# Patient Record
Sex: Male | Born: 1995 | Race: White | Hispanic: No | Marital: Single | Smoking: Never smoker
Health system: Southern US, Community
[De-identification: ages and names within clinical notes are randomized; demographics above are authoritative.]

## PROBLEM LIST (undated history)

## (undated) DIAGNOSIS — N200 Calculus of kidney: Secondary | ICD-10-CM

---
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
## 2020-04-06 ENCOUNTER — Ambulatory Visit: Payer: Self-pay

## 2021-03-09 ENCOUNTER — Other Ambulatory Visit: Payer: Self-pay

## 2021-03-09 ENCOUNTER — Encounter: Payer: Self-pay | Admitting: Emergency Medicine

## 2021-03-09 ENCOUNTER — Emergency Department
Admission: EM | Admit: 2021-03-09 | Discharge: 2021-03-09 | Disposition: A | Discharge status: Home / Self Care | Payer: Self-pay | Attending: Emergency Medicine | Admitting: Emergency Medicine

## 2021-03-09 ENCOUNTER — Emergency Department: Payer: Self-pay

## 2021-03-09 DIAGNOSIS — F17220 Nicotine dependence, chewing tobacco, uncomplicated: Secondary | ICD-10-CM | POA: Insufficient documentation

## 2021-03-09 DIAGNOSIS — R109 Unspecified abdominal pain: Secondary | ICD-10-CM

## 2021-03-09 DIAGNOSIS — N201 Calculus of ureter: Secondary | ICD-10-CM | POA: Insufficient documentation

## 2021-03-09 LAB — COMPREHENSIVE METABOLIC PANEL
ALT: 20 U/L (ref 0–44)
AST: 17 U/L (ref 15–41)
Albumin: 4.7 g/dL (ref 3.5–5.0)
Alkaline Phosphatase: 52 U/L (ref 38–126)
Anion gap: 10 (ref 5–15)
BUN: 12 mg/dL (ref 6–20)
CO2: 30 mmol/L (ref 22–32)
Calcium: 9.4 mg/dL (ref 8.9–10.3)
Chloride: 99 mmol/L (ref 98–111)
Creatinine, Ser: 0.97 mg/dL (ref 0.61–1.24)
GFR, Estimated: >60 mL/min (ref >60)
Glucose, Bld: 110 mg/dL — ABNORMAL HIGH (ref 70–99)
Potassium: 3.6 mmol/L (ref 3.5–5.1)
Sodium: 139 mmol/L (ref 135–145)
Total Bilirubin: 0.6 mg/dL (ref 0.3–1.2)
Total Protein: 7.8 g/dL (ref 6.5–8.1)

## 2021-03-09 LAB — CBC WITH DIFFERENTIAL/PLATELET
Abs Immature Granulocytes: 0.04 10*3/uL (ref 0.00–0.07)
Basophils Absolute: 0.1 10*3/uL (ref 0.0–0.1)
Basophils Relative: 1 %
Eosinophils Absolute: 0.5 10*3/uL (ref 0.0–0.5)
Eosinophils Relative: 5 %
HCT: 46.9 % (ref 39.0–52.0)
Hemoglobin: 16.6 g/dL (ref 13.0–17.0)
Immature Granulocytes: 0 %
Lymphocytes Relative: 27 %
Lymphs Abs: 2.7 10*3/uL (ref 0.7–4.0)
MCH: 30.3 pg (ref 26.0–34.0)
MCHC: 35.4 g/dL (ref 30.0–36.0)
MCV: 85.6 fL (ref 80.0–100.0)
Monocytes Absolute: 0.9 10*3/uL (ref 0.1–1.0)
Monocytes Relative: 9 %
Neutro Abs: 5.7 10*3/uL (ref 1.7–7.7)
Neutrophils Relative %: 58 %
Platelets: 405 10*3/uL — ABNORMAL HIGH (ref 150–400)
RBC: 5.48 MIL/uL (ref 4.22–5.81)
RDW: 12 % (ref 11.5–15.5)
WBC: 9.9 10*3/uL (ref 4.0–10.5)
nRBC: 0 % (ref 0.0–0.2)

## 2021-03-09 LAB — URINALYSIS, ROUTINE W REFLEX MICROSCOPIC
Bacteria, UA: NONE SEEN
Bilirubin Urine: NEGATIVE
Glucose, UA: NEGATIVE mg/dL
Ketones, ur: NEGATIVE mg/dL
Leukocytes,Ua: NEGATIVE
Nitrite: POSITIVE — AB
Protein, ur: NEGATIVE mg/dL
Specific Gravity, Urine: 1.005 (ref 1.005–1.030)
Squamous Epithelial / HPF: NONE SEEN (ref 0–5)
pH: 5 (ref 5.0–8.0)

## 2021-03-09 LAB — LIPASE, BLOOD: Lipase: 44 U/L (ref 11–51)

## 2021-03-09 MED ORDER — ONDANSETRON HCL 4 MG/2ML IJ SOLN
4.0000 mg | INTRAMUSCULAR | Status: AC
  Administered 2021-03-09: 4 mg via INTRAVENOUS
  Filled 2021-03-09: qty 2

## 2021-03-09 MED ORDER — MORPHINE SULFATE (PF) 4 MG/ML IV SOLN
4.0000 mg | Freq: Once | INTRAVENOUS | Status: AC
Start: 2021-03-09 — End: 2021-03-09
  Administered 2021-03-09: 4 mg via INTRAVENOUS
  Filled 2021-03-09: qty 1

## 2021-03-09 MED ORDER — KETOROLAC TROMETHAMINE 30 MG/ML IJ SOLN
15.0000 mg | Freq: Once | INTRAMUSCULAR | Status: AC
  Administered 2021-03-09: 15 mg via INTRAVENOUS
  Filled 2021-03-09: qty 1

## 2021-03-09 MED ORDER — OXYCODONE-ACETAMINOPHEN 5-325 MG PO TABS: 1.0000 | ORAL_TABLET | ORAL | 0 refills | Status: DC | PRN

## 2021-03-09 MED ORDER — ONDANSETRON 4 MG PO TBDP: 4.0000 mg | ORAL_TABLET | Freq: Three times a day (TID) | ORAL | 0 refills | Status: DC | PRN

## 2021-03-09 NOTE — ED Triage Notes (Signed)
Pt to ED from home c/o left flank pain starting last night, denies urinary changes or injuries or n/v/d.  Pt A&Ox4, chest rise even and unlabored, in NAD at this time.

## 2021-03-09 NOTE — ED Provider Notes (Signed)
-----------------------------------------   7:03 AM on 03/09/2021 -----------------------------------------  Blood pressure 127/62, pulse 70, temperature 97.9 F (36.6 C), temperature source Oral, resp. rate 16, height 5\' 10"  (1.778 m), weight 90.7 kg, SpO2 100 %.  Assuming care from Dr. .  In short, Jesse Carlson is a 25 y.o. male with a chief complaint of Flank Pain .  Refer to the original H&P for additional details.  The current plan of care is to follow-up imaging and labs for possible kidney stone vs msk pain.  ----------------------------------------- 10:16 AM on 03/09/2021 -----------------------------------------  Lab work is reassuring, CT scan does show small left ureterolithiasis at the UVJ.  UA is positive for nitrites but otherwise has no other sign of infection, we will send urine for culture but low suspicion for associated infection at this time.  Patient is feeling much better and is appropriate for discharge home with urology follow-up as needed.  He was counseled to return to the ED for new worsening symptoms, patient agrees with plan.    03/11/2021, MD 03/09/21 1017

## 2021-03-09 NOTE — ED Provider Notes (Signed)
[facility] Emergency Department Provider Note  ____________________________________________   Event Date/Time   First MD Initiated Contact with Patient 03/09/21 [phone]     (approximate)  I have reviewed the triage vital signs and the nursing notes.   HISTORY  Chief Complaint Flank Pain    HPI Jesse Carlson is a 25 y.o. male who has had some prior issues with lumbar strain but otherwise is healthy with no chronic conditions.  He presents for evaluation of acute onset and severe sharp stabbing pain in his right flank and lower back.  It does not radiate to the front.  It has caused him to have some nausea.  Nothing in particular makes it better or worse.  It has been coming and going over the course of the last few hours but became very severe just prior to him coming to the emergency department.  He has not noticed any urinary symptoms including hematuria nor dysuria.  He denies fever/chills, sore throat, chest pain, shortness of breath, vomiting, and lower abdominal pain.  No pain in his penis or testicles.  He has had similar episodes in the past including one time that was similar but milder where he was seen at [facility] and told that it was just a back problem like a ruptured disc.  The symptoms eventually went away.  He does not take any medications regularly and has not tried taking anything tonight.         History reviewed. No pertinent past medical history.  There are no problems to display for this patient.   History reviewed. No pertinent surgical history.  Prior to Admission medications   Medication Sig Start Date End Date Taking? Authorizing Provider  predniSONE (DELTASONE) 20 MG tablet Take 2 tablets (40 mg total) by mouth daily. 10/08/16   Minna Antis, MD    Allergies Patient has no known allergies.  History reviewed. No pertinent family history.  Social History Social History   Tobacco Use  . Smoking status: Never Smoker   . Smokeless tobacco: Current User  Substance Use Topics  . Alcohol use: No  . Drug use: Never    Review of Systems Constitutional: No fever/chills Eyes: No visual changes. ENT: No sore throat. Cardiovascular: Denies chest pain. Respiratory: Denies shortness of breath. Gastrointestinal: No abdominal pain.  Positive for nausea, no vomiting.  No diarrhea.  No constipation. Genitourinary: Negative for dysuria. Musculoskeletal: Positive for left-sided flank pain. Integumentary: Negative for rash. Neurological: Negative for headaches, focal weakness or numbness.   ____________________________________________   PHYSICAL EXAM:  VITAL SIGNS: ED Triage Vitals  Enc Vitals Group     BP 03/09/21 0616 127/62     Pulse Rate 03/09/21 0616 70     Resp 03/09/21 0616 16     Temp 03/09/21 0616 97.9 F (36.6 C)     Temp Source 03/09/21 0616 Oral     SpO2 03/09/21 0616 100 %     Weight 03/09/21 0619 90.7 kg (200 lb)     Height 03/09/21 0619 1.778 m (5\' 10" )     Head Circumference --      Peak Flow --      Pain Score 03/09/21 0616 10     Pain Loc --      Pain Edu? --      Excl. in GC? --     Constitutional: Alert and oriented.  Appears very uncomfortable. Eyes: Conjunctivae are normal.  Head: Atraumatic. Nose: No congestion/rhinnorhea. Mouth/Throat: Patient is wearing  a mask. Neck: No stridor.  No meningeal signs.   Cardiovascular: Normal rate, regular rhythm. Good peripheral circulation. Respiratory: Normal respiratory effort.  No retractions. Gastrointestinal: Soft and nontender. No distention.  Musculoskeletal: Left flank tenderness to percussion.  No tenderness to palpation of the lumbar spine.  Some tenderness to palpation of the left lumbar soft tissue. Neurologic:  Normal speech and language. No gross focal neurologic deficits are appreciated.  Skin:  Skin is warm, dry and intact. Psychiatric: Mood and affect are normal. Speech and behavior are  normal.  ____________________________________________   LABS (all labs ordered are listed, but only abnormal results are displayed)  Labs Reviewed  CBC WITH DIFFERENTIAL/PLATELET - Abnormal; Notable for the following components:      Result Value   Platelets 405 (*)    All other components within normal limits  URINALYSIS, ROUTINE W REFLEX MICROSCOPIC  COMPREHENSIVE METABOLIC PANEL  LIPASE, BLOOD   ____________________________________________  EKG  No indication for emergent EKG ____________________________________________  RADIOLOGY Marylou Mccoy, personally viewed and evaluated these images (plain radiographs) as part of my medical decision making, as well as reviewing the written report by the radiologist.  ED MD interpretation:  CT renal pending  Official radiology report(s): No results found.  ____________________________________________   PROCEDURES   Procedure(s) performed (including Critical Care):  Procedures   ____________________________________________   INITIAL IMPRESSION / MDM / ASSESSMENT AND PLAN / ED COURSE  As part of my medical decision making, I reviewed the following data within the electronic MEDICAL RECORD NUMBER History obtained from family, Nursing notes reviewed and incorporated, Labs reviewed , Old chart reviewed, Patient signed out to Dr. Larinda Buttery and Notes from prior ED visits   Differential diagnosis includes, but is not limited to, renal/ureteral colic, UTI/pyelonephritis, musculoskeletal strain, herniated disc, cauda equina syndrome.  The patient is not having any focal neurological deficits.  Strongly doubt acute neurological compromise such as cauda equina syndrome.  Herniated disc is possible that the patient's symptoms are located more in his flank and based on the episodic nature and the way he looks uncomfortable currently, I am more suspicious of ureteral colic.  Vital signs are stable and within normal limits.  Lab work pending.   Morphine 4 mg IV, Toradol 15 mg IV, and Zofran 4 mg IV.  CT renal stone protocol pending.       Clinical Course as of 03/09/21 0707  Wed Mar 09, 2021  0706 Transferring ED care to Dr. Larinda Buttery. [CF]    Clinical Course User Index [CF] Loleta Rose, MD     ____________________________________________  FINAL CLINICAL IMPRESSION(S) / ED DIAGNOSES  Final diagnoses:  Left flank pain     MEDICATIONS GIVEN DURING THIS VISIT:  Medications  morphine 4 MG/ML injection 4 mg (4 mg Intravenous Given 03/09/21 0625)  ondansetron (ZOFRAN) injection 4 mg (4 mg Intravenous Given 03/09/21 0624)  ketorolac (TORADOL) 30 MG/ML injection 15 mg (15 mg Intravenous Given 03/09/21 1610)     ED Discharge Orders    None      *Please note:  FENTON CANDEE was evaluated in Emergency Department on 03/09/2021 for the symptoms described in the history of present illness. He was evaluated in the context of the global COVID-19 pandemic, which necessitated consideration that the patient might be at risk for infection with the SARS-CoV-2 virus that causes COVID-19. Institutional protocols and algorithms that pertain to the evaluation of patients at risk for COVID-19 are in a state of rapid change based on information  released by regulatory bodies including the CDC and federal and state organizations. These policies and algorithms were followed during the patient's care in the ED.  Some ED evaluations and interventions may be delayed as a result of limited staffing during and after the pandemic.*  Note:  This document was prepared using Dragon voice recognition software and may include unintentional dictation errors.   Loleta Rose, MD 03/09/21 [phone]

## 2021-03-10 LAB — URINE CULTURE: Culture: NO GROWTH

## 2022-03-08 ENCOUNTER — Emergency Department: Payer: 59

## 2022-03-08 ENCOUNTER — Emergency Department
Admission: EM | Admit: 2022-03-08 | Discharge: 2022-03-08 | Disposition: A | Discharge status: Home / Self Care | Payer: 59 | Attending: Student in an Organized Health Care Education/Training Program | Admitting: Student in an Organized Health Care Education/Training Program

## 2022-03-08 DIAGNOSIS — R109 Unspecified abdominal pain: Secondary | ICD-10-CM | POA: Diagnosis present

## 2022-03-08 DIAGNOSIS — N200 Calculus of kidney: Secondary | ICD-10-CM

## 2022-03-08 DIAGNOSIS — R10A2 Flank pain, left side: Secondary | ICD-10-CM

## 2022-03-08 LAB — COMPREHENSIVE METABOLIC PANEL
ALT: 27 U/L (ref 0–44)
AST: 21 U/L (ref 15–41)
Albumin: 4.9 g/dL (ref 3.5–5.0)
Alkaline Phosphatase: 57 U/L (ref 38–126)
Anion gap: 8 (ref 5–15)
BUN: 20 mg/dL (ref 6–20)
CO2: 32 mmol/L (ref 22–32)
Calcium: 10.3 mg/dL (ref 8.9–10.3)
Chloride: 98 mmol/L (ref 98–111)
Creatinine, Ser: 1.27 mg/dL — ABNORMAL HIGH (ref 0.61–1.24)
GFR, Estimated: >60 mL/min (ref >60)
Glucose, Bld: 114 mg/dL — ABNORMAL HIGH (ref 70–99)
Potassium: 3.6 mmol/L (ref 3.5–5.1)
Sodium: 138 mmol/L (ref 135–145)
Total Bilirubin: 0.5 mg/dL (ref 0.3–1.2)
Total Protein: 8.4 g/dL — ABNORMAL HIGH (ref 6.5–8.1)

## 2022-03-08 LAB — URINALYSIS, ROUTINE W REFLEX MICROSCOPIC
Bilirubin Urine: NEGATIVE
Glucose, UA: NEGATIVE mg/dL
Ketones, ur: NEGATIVE mg/dL
Leukocytes,Ua: NEGATIVE
Nitrite: NEGATIVE
Protein, ur: 30 mg/dL — AB
RBC / HPF: >50 RBC/hpf — ABNORMAL HIGH (ref 0–5)
Specific Gravity, Urine: 1.023 (ref 1.005–1.030)
pH: 5 (ref 5.0–8.0)

## 2022-03-08 LAB — CBC
HCT: 50.6 % (ref 39.0–52.0)
Hemoglobin: 17.5 g/dL — ABNORMAL HIGH (ref 13.0–17.0)
MCH: 29.9 pg (ref 26.0–34.0)
MCHC: 34.6 g/dL (ref 30.0–36.0)
MCV: 86.3 fL (ref 80.0–100.0)
Platelets: 444 10*3/uL — ABNORMAL HIGH (ref 150–400)
RBC: 5.86 MIL/uL — ABNORMAL HIGH (ref 4.22–5.81)
RDW: 12.2 % (ref 11.5–15.5)
WBC: 9.1 10*3/uL (ref 4.0–10.5)
nRBC: 0 % (ref 0.0–0.2)

## 2022-03-08 MED ORDER — ONDANSETRON HCL 4 MG/2ML IJ SOLN
4.0000 mg | Freq: Once | INTRAMUSCULAR | Status: AC
  Administered 2022-03-08: 4 mg via INTRAVENOUS
  Filled 2022-03-08: qty 2

## 2022-03-08 MED ORDER — OXYCODONE-ACETAMINOPHEN 5-325 MG PO TABS: 1.0000 | ORAL_TABLET | ORAL | 0 refills | Status: DC | PRN

## 2022-03-08 MED ORDER — TAMSULOSIN HCL 0.4 MG PO CAPS: 0.4000 mg | ORAL_CAPSULE | Freq: Every day | ORAL | 0 refills | Status: DC

## 2022-03-08 MED ORDER — ONDANSETRON 4 MG PO TBDP: 4.0000 mg | ORAL_TABLET | Freq: Three times a day (TID) | ORAL | 0 refills | Status: DC | PRN

## 2022-03-08 MED ORDER — MORPHINE SULFATE (PF) 4 MG/ML IV SOLN
4.0000 mg | INTRAVENOUS | Status: DC | PRN
  Administered 2022-03-08: 4 mg via INTRAVENOUS
  Filled 2022-03-08: qty 1

## 2022-03-08 MED ORDER — KETOROLAC TROMETHAMINE 30 MG/ML IJ SOLN
15.0000 mg | Freq: Once | INTRAMUSCULAR | Status: AC
  Administered 2022-03-08: 15 mg via INTRAVENOUS
  Filled 2022-03-08: qty 1

## 2022-03-08 MED ORDER — CEPHALEXIN 500 MG PO CAPS: 500.0000 mg | ORAL_CAPSULE | Freq: Two times a day (BID) | ORAL | 0 refills | Status: AC

## 2022-03-08 NOTE — ED Provider Notes (Signed)
----------------------------------------- ?  5:35 PM on 03/08/2022 ?----------------------------------------- ?Patient care assumed from Dr. Quentin Cornwall.  Patient's urinalysis does show red blood cells which is to be expected with the patient CT findings of ureterolithiasis but also shows a mild amount of white blood cells and rare bacteria.  I have sent a urine culture as a precaution however we will cover with antibiotics as a precaution have the patient follow-up with urology.  Patient agreeable to plan of care.  Discussed my typical kidney stone return precautions. ?  ?Harvest Dark, MD ?03/08/22 1735 ? ?

## 2022-03-08 NOTE — ED Notes (Signed)
Pt. In CT. 

## 2022-03-08 NOTE — ED Triage Notes (Signed)
C/O left flank pain since early this am.  States has history of kidney stones and pain is similar. ?

## 2022-03-08 NOTE — ED Provider Notes (Signed)
? ?[facility] ?Provider Note ? ? ? Event Date/Time  ? First MD Initiated Contact with Patient 03/08/22 1311   ?  (approximate) ? ? ?History  ? ?Flank Pain ? ? ?HPI ? ?Jesse Carlson is a 26 y.o. male   with a history of kidney stones presents to the ER for evaluation of left flank pain that started last night feels consistent with previous kidney stones denies any dysuria fevers or chills.  States the pain is moderate to severe is not taking thing for pain at home. ? ?  ? ? ?Physical Exam  ? ?Triage Vital Signs: ?ED Triage Vitals [03/08/22 1310]  ?Enc Vitals Group  ?   BP (!) 145/96  ?   Pulse Rate 100  ?   Resp 16  ?   Temp 97.9 ?F (36.6 ?C)  ?   Temp Source Oral  ?   SpO2 100 %  ?   Weight 200 lb (90.7 kg)  ?   Height 5\' 11"  (1.803 m)  ?   Head Circumference   ?   Peak Flow   ?   Pain Score 10  ?   Pain Loc   ?   Pain Edu?   ?   Excl. in GC?   ? ? ?Most recent vital signs: ?Vitals:  ? 03/08/22 1310 03/08/22 1320  ?BP: (!) 145/96 (!) 131/93  ?Pulse: 100 86  ?Resp: 16   ?Temp: 97.9 ?F (36.6 ?C)   ?SpO2: 100% 100%  ? ? ? ?Constitutional: Alert  ?Eyes: Conjunctivae are normal.  ?Head: Atraumatic. ?Nose: No congestion/rhinnorhea. ?Mouth/Throat: Mucous membranes are moist.   ?Neck: Painless ROM.  ?Cardiovascular:   Good peripheral circulation. ?Respiratory: Normal respiratory effort.  No retractions.  ?Gastrointestinal: Soft and nontender.  ?Musculoskeletal:  no deformity ?Neurologic:  MAE spontaneously. No gross focal neurologic deficits are appreciated.  ?Skin:  Skin is warm, dry and intact. No rash noted. ?Psychiatric: Mood and affect are normal. Speech and behavior are normal. ? ? ? ?ED Results / Procedures / Treatments  ? ?Labs ?(all labs ordered are listed, but only abnormal results are displayed) ?Labs Reviewed  ?CBC - Abnormal; Notable for the following components:  ?    Result Value  ? RBC 5.86 (*)   ? Hemoglobin 17.5 (*)   ? Platelets 444 (*)   ? All other components within normal  limits  ?COMPREHENSIVE METABOLIC PANEL - Abnormal; Notable for the following components:  ? Glucose, Bld 114 (*)   ? Creatinine, Ser 1.27 (*)   ? Total Protein 8.4 (*)   ? All other components within normal limits  ?URINALYSIS, ROUTINE W REFLEX MICROSCOPIC  ? ? ? ?EKG ? ? ? ? ?RADIOLOGY ?Please see ED Course for my review and interpretation. ? ?I personally reviewed all radiographic images ordered to evaluate for the above acute complaints and reviewed radiology reports and findings.  These findings were personally discussed with the patient.  Please see medical record for radiology report. ? ? ? ?PROCEDURES: ? ?Critical Care performed:  ? ?Procedures ? ? ?MEDICATIONS ORDERED IN ED: ?Medications  ?morphine (PF) 4 MG/ML injection 4 mg (4 mg Intravenous Given 03/08/22 1346)  ?ondansetron (ZOFRAN) injection 4 mg (4 mg Intravenous Given 03/08/22 1345)  ?ketorolac (TORADOL) 30 MG/ML injection 15 mg (15 mg Intravenous Given 03/08/22 1406)  ? ? ? ?IMPRESSION / MDM / ASSESSMENT AND PLAN / ED COURSE  ?I reviewed the triage vital signs and the nursing notes. ?             ?               ? ?  Differential diagnosis includes, but is not limited to, stone, pyelo-, renal colic, musculoskeletal pain, colitis ? ?Patient presented to the ER for evaluation of flank pain as described above clinically appears consistent with kidney stone.  CT imaging ordered for the above differential order IV fluids IV morphine and IV Zofran. ? ? ?Clinical Course as of 03/08/22 1510  ?Wed Mar 08, 2022  ?1335 CT imaging my review and interpretation does show evidence of left ureteral stone. [PR]  ?1450 Patient much more comfortable appearing resting in no acute distress awaiting urinalysis if negative does appear stable and appropriate for outpatient follow-up at this point. [PR]  ?  ?Clinical Course User Index ?[PR] Willy Eddy, MD  ? ? ? ?FINAL CLINICAL IMPRESSION(S) / ED DIAGNOSES  ? ?Final diagnoses:  ?Left flank pain  ?Kidney stone  ? ? ? ?Rx /  DC Orders  ? ?ED Discharge Orders   ? ?      Ordered  ?  oxyCODONE-acetaminophen (PERCOCET) 5-325 MG tablet  Every 4 hours PRN,   Status:  Discontinued       ? 03/08/22 1509  ?  ondansetron (ZOFRAN-ODT) 4 MG disintegrating tablet  Every 8 hours PRN       ? 03/08/22 1509  ?  tamsulosin (FLOMAX) 0.4 MG CAPS capsule  Daily after supper       ? 03/08/22 1509  ?  oxyCODONE-acetaminophen (PERCOCET) 5-325 MG tablet  Every 4 hours PRN       ? 03/08/22 1510  ? ?  ?  ? ?  ? ? ? ?Note:  This document was prepared using Dragon voice recognition software and may include unintentional dictation errors. ? ?  ?Willy Eddy, MD ?03/08/22 1527 ? ?

## 2022-03-09 ENCOUNTER — Emergency Department
Admission: EM | Admit: 2022-03-09 | Discharge: 2022-03-09 | Disposition: A | Discharge status: Home / Self Care | Payer: 59 | Attending: Emergency Medicine | Admitting: Emergency Medicine

## 2022-03-09 ENCOUNTER — Encounter: Payer: Self-pay | Admitting: Emergency Medicine

## 2022-03-09 ENCOUNTER — Other Ambulatory Visit: Payer: Self-pay

## 2022-03-09 DIAGNOSIS — R109 Unspecified abdominal pain: Secondary | ICD-10-CM

## 2022-03-09 DIAGNOSIS — N200 Calculus of kidney: Secondary | ICD-10-CM

## 2022-03-09 DIAGNOSIS — N132 Hydronephrosis with renal and ureteral calculous obstruction: Secondary | ICD-10-CM | POA: Diagnosis not present

## 2022-03-09 DIAGNOSIS — D72829 Elevated white blood cell count, unspecified: Secondary | ICD-10-CM | POA: Diagnosis not present

## 2022-03-09 HISTORY — DX: Calculus of kidney: N20.0

## 2022-03-09 LAB — BASIC METABOLIC PANEL WITH GFR
Anion gap: 8 (ref 5–15)
BUN: 19 mg/dL (ref 6–20)
CO2: 31 mmol/L (ref 22–32)
Calcium: 9.9 mg/dL (ref 8.9–10.3)
Chloride: 99 mmol/L (ref 98–111)
Creatinine, Ser: 1.23 mg/dL (ref 0.61–1.24)
GFR, Estimated: >60 mL/min
Glucose, Bld: 120 mg/dL — ABNORMAL HIGH (ref 70–99)
Potassium: 3.5 mmol/L (ref 3.5–5.1)
Sodium: 138 mmol/L (ref 135–145)

## 2022-03-09 LAB — URINALYSIS, ROUTINE W REFLEX MICROSCOPIC
Bacteria, UA: NONE SEEN
Bilirubin Urine: NEGATIVE
Glucose, UA: NEGATIVE mg/dL
Hgb urine dipstick: NEGATIVE
Ketones, ur: NEGATIVE mg/dL
Leukocytes,Ua: NEGATIVE
Nitrite: NEGATIVE
Protein, ur: NEGATIVE mg/dL
Specific Gravity, Urine: 1.013 (ref 1.005–1.030)
Squamous Epithelial / HPF: NONE SEEN (ref 0–5)
WBC, UA: NONE SEEN WBC/hpf (ref 0–5)
pH: 8 (ref 5.0–8.0)

## 2022-03-09 LAB — CBC
HCT: 51.6 % (ref 39.0–52.0)
Hemoglobin: 17.3 g/dL — ABNORMAL HIGH (ref 13.0–17.0)
MCH: 29.1 pg (ref 26.0–34.0)
MCHC: 33.5 g/dL (ref 30.0–36.0)
MCV: 86.9 fL (ref 80.0–100.0)
Platelets: 398 10*3/uL (ref 150–400)
RBC: 5.94 MIL/uL — ABNORMAL HIGH (ref 4.22–5.81)
RDW: 12.1 % (ref 11.5–15.5)
WBC: 12.9 10*3/uL — ABNORMAL HIGH (ref 4.0–10.5)
nRBC: 0 % (ref 0.0–0.2)

## 2022-03-09 MED ORDER — KETOROLAC TROMETHAMINE 30 MG/ML IJ SOLN
30.0000 mg | Freq: Once | INTRAMUSCULAR | Status: AC
  Administered 2022-03-09: 30 mg via INTRAVENOUS
  Filled 2022-03-09: qty 1

## 2022-03-09 MED ORDER — FENTANYL CITRATE PF 50 MCG/ML IJ SOSY
50.0000 ug | PREFILLED_SYRINGE | INTRAMUSCULAR | Status: DC | PRN
  Administered 2022-03-09: 50 ug via INTRAVENOUS
  Filled 2022-03-09 (×2): qty 1

## 2022-03-09 MED ORDER — SODIUM CHLORIDE 0.9 % IV BOLUS
1000.0000 mL | Freq: Once | INTRAVENOUS | Status: AC
  Administered 2022-03-09: 1000 mL via INTRAVENOUS

## 2022-03-09 NOTE — ED Notes (Signed)
Pt A&O, IV removed, pt given discharge instructions, pt ambulating with steady gait. 

## 2022-03-09 NOTE — Discharge Instructions (Addendum)
Your labs overall reassuring at this time.  You been hydrated in the ED and passed urine without difficulty.  Continue with previously prescribed antibiotics, pain medicine, nausea medicine, and bladder spasm medicine.  Follow-up with urology as scheduled. ?

## 2022-03-09 NOTE — ED Provider Notes (Signed)
? ? ?[facility] ?Emergency Department Provider Note ? ? ? ? Event Date/Time  ? First MD Initiated Contact with Patient 03/09/22 1545   ?  (approximate) ? ? ?History  ? ?Flank Pain ? ? ?HPI ? ?Jesse Carlson is a 26 y.o. male returns to the ED for evaluation of continued left flank pain. He was evaluated in the ED yesterday for the same. He was found to have multiple non-obstructing stones bilaterally, with a 0.4 cm stone on the left, with mild hydronephrosis. He returns today, via EMS with continued pain and decreased urinary output.  Patient has not been able to eat or drink much since his discharge yesterday.  He denies any fever, chills, nausea, or vomiting today.  He reports taking prescribed pain medicine and antibiotics as directed.  ? ? ?Physical Exam  ? ?Triage Vital Signs: ?ED Triage Vitals  ?Enc Vitals Group  ?   BP 03/09/22 1503 (!) 139/91  ?   Pulse Rate 03/09/22 1503 89  ?   Resp 03/09/22 1503 20  ?   Temp 03/09/22 1503 97.8 ?F (36.6 ?C)  ?   Temp Source 03/09/22 1503 Oral  ?   SpO2 03/09/22 1503 97 %  ?   Weight 03/09/22 1439 200 lb (90.7 kg)  ?   Height 03/09/22 1439 5\' 11"  (1.803 m)  ?   Head Circumference --   ?   Peak Flow --   ?   Pain Score 03/09/22 1439 10  ?   Pain Loc --   ?   Pain Edu? --   ?   Excl. in GC? --   ? ? ?Most recent vital signs: ?Vitals:  ? 03/09/22 2140 03/09/22 2141  ?BP: 113/70 113/78  ?Pulse: 84 88  ?Resp: 16 18  ?Temp: 98 ?F (36.7 ?C)   ?SpO2: 100% 98%  ? ? ?General Awake, no distress.  ?CV:  Good peripheral perfusion.  ?RESP:  Normal effort.  ?ABD:  No distention.  Soft and nontender.  No CVA tenderness elicited.  Normoactive bowel sounds noted. ? ? ?ED Results / Procedures / Treatments  ? ?Labs ?(all labs ordered are listed, but only abnormal results are displayed) ?Labs Reviewed  ?URINALYSIS, ROUTINE W REFLEX MICROSCOPIC - Abnormal; Notable for the following components:  ?    Result Value  ? Color, Urine YELLOW (*)   ? APPearance HAZY (*)   ?  All other components within normal limits  ?BASIC METABOLIC PANEL - Abnormal; Notable for the following components:  ? Glucose, Bld 120 (*)   ? All other components within normal limits  ?CBC - Abnormal; Notable for the following components:  ? WBC 12.9 (*)   ? RBC 5.94 (*)   ? Hemoglobin 17.3 (*)   ? All other components within normal limits  ? ? ? ?EKG ? ? ?RADIOLOGY ? ? ?ED Provider Interpretation: } ? ?CT Renal Stone Study (03/08/22) ? ?IMPRESSION: ?1. There is a 0.4 cm calculus in the proximal third of the left ?ureter with mild associated hydronephrosis and hydroureter. ?  ?2.  Multiple additional small bilateral renal calculi. ? ? ?PROCEDURES: ? ?Critical Care performed: No ? ?Procedures ? ? ?MEDICATIONS ORDERED IN ED: ?Medications  ?fentaNYL (SUBLIMAZE) injection 50 mcg (50 mcg Intravenous Given 03/09/22 1520)  ?sodium chloride 0.9 % bolus 1,000 mL (0 mLs Intravenous Stopped 03/09/22 1746)  ?ketorolac (TORADOL) 30 MG/ML injection 30 mg (30 mg Intravenous Given 03/09/22 1617)  ?sodium chloride 0.9 % bolus  1,000 mL (0 mLs Intravenous Stopped 03/09/22 2144)  ? ? ? ?IMPRESSION / MDM / ASSESSMENT AND PLAN / ED COURSE  ?I reviewed the triage vital signs and the nursing notes. ?             ?               ? ?Differential diagnosis includes, but is not limited to, urinary retention, hydronephrosis, sepsis, dehydration ? ?Patient returns to the ED for subsequent evaluation of ongoing left flank pain.  Patient presents in no acute distress, but does endorse decreased oral intake and decreased urine output since his discharge yesterday.  Patient been taking medications as prescribed, but only notes very concentrated urine.  Patient is reevaluated in the ED and found to have reassuring work-up.  Slight bump in his white blood cells from yesterday at 12.9, which is consistent with his nephrolithiasis and mild hydronephrosis noted yesterday on CT.  Patient today without any acute abdominal exam findings.  UA is without  leukocytosis, hematuria, or WBC casts.  She has received a 2 L fluid bolus in the ED, and is able to spontaneously void without difficulty.  Dr. Jason Fila patient has been resting comfortably in the room without complaints of intractable pain.  Patient's diagnosis is consistent with nephrolithiasis and dehydration. Patient will be discharged home with directions to continue previously prescribed medications including antibiotics, pain medicines, and Flomax. Patient is to follow up with urology as needed or otherwise directed. Patient is given ED precautions to return to the ED for any worsening or new symptoms. ? ? ?FINAL CLINICAL IMPRESSION(S) / ED DIAGNOSES  ? ?Final diagnoses:  ?Left flank pain  ?Nephrolithiasis  ? ? ? ?Rx / DC Orders  ? ?ED Discharge Orders   ? ? None  ? ?  ? ? ? ?Note:  This document was prepared using Dragon voice recognition software and may include unintentional dictation errors. ? ?  ?Lissa Hoard, PA-C ?03/09/22 2321 ? ?  ?Shaune Pollack, MD ?03/10/22 2006 ? ?

## 2022-03-09 NOTE — ED Triage Notes (Signed)
Pt comes into the ED via [facility] from home c/o left flank pain.  Pt has a h/o kidney stones.  Pt was seen and treated here yesterday for the same.  Pt has been taking the medications as directed.  All VSS stable at this time.  ?

## 2022-03-09 NOTE — ED Notes (Addendum)
See triage note pt c/o left sided flank pain that started yesterday. Pt was seen yesterday and states the pain came back.  Pt states he has a hx of kidney stones in the past.  ?

## 2022-03-10 LAB — URINE CULTURE: Culture: NO GROWTH

## 2022-03-23 ENCOUNTER — Emergency Department
Admission: EM | Admit: 2022-03-23 | Discharge: 2022-03-23 | Discharge status: Against Medical Advice | Payer: 59 | Attending: Emergency Medicine | Admitting: Emergency Medicine

## 2022-03-23 ENCOUNTER — Other Ambulatory Visit: Payer: Self-pay

## 2022-03-23 DIAGNOSIS — N200 Calculus of kidney: Secondary | ICD-10-CM | POA: Insufficient documentation

## 2022-03-23 DIAGNOSIS — R112 Nausea with vomiting, unspecified: Secondary | ICD-10-CM | POA: Insufficient documentation

## 2022-03-23 DIAGNOSIS — R1032 Left lower quadrant pain: Secondary | ICD-10-CM | POA: Diagnosis present

## 2022-03-23 DIAGNOSIS — Z5321 Procedure and treatment not carried out due to patient leaving prior to being seen by health care provider: Secondary | ICD-10-CM | POA: Insufficient documentation

## 2022-03-23 LAB — CBC
HCT: 48.1 % (ref 39.0–52.0)
Hemoglobin: 16.4 g/dL (ref 13.0–17.0)
MCH: 29.4 pg (ref 26.0–34.0)
MCHC: 34.1 g/dL (ref 30.0–36.0)
MCV: 86.4 fL (ref 80.0–100.0)
Platelets: 376 10*3/uL (ref 150–400)
RBC: 5.57 MIL/uL (ref 4.22–5.81)
RDW: 11.9 % (ref 11.5–15.5)
WBC: 9.9 10*3/uL (ref 4.0–10.5)
nRBC: 0 % (ref 0.0–0.2)

## 2022-03-23 LAB — URINALYSIS, ROUTINE W REFLEX MICROSCOPIC
Bacteria, UA: NONE SEEN
Bilirubin Urine: NEGATIVE
Glucose, UA: NEGATIVE mg/dL
Ketones, ur: NEGATIVE mg/dL
Leukocytes,Ua: NEGATIVE
Nitrite: NEGATIVE
Protein, ur: NEGATIVE mg/dL
Specific Gravity, Urine: 1.013 (ref 1.005–1.030)
Squamous Epithelial / HPF: NONE SEEN (ref 0–5)
pH: 7 (ref 5.0–8.0)

## 2022-03-23 LAB — BASIC METABOLIC PANEL
Anion gap: 7 (ref 5–15)
BUN: 22 mg/dL — ABNORMAL HIGH (ref 6–20)
CO2: 31 mmol/L (ref 22–32)
Calcium: 9.9 mg/dL (ref 8.9–10.3)
Chloride: 97 mmol/L — ABNORMAL LOW (ref 98–111)
Creatinine, Ser: 1.58 mg/dL — ABNORMAL HIGH (ref 0.61–1.24)
GFR, Estimated: >60 mL/min (ref >60)
Glucose, Bld: 97 mg/dL (ref 70–99)
Potassium: 4.6 mmol/L (ref 3.5–5.1)
Sodium: 135 mmol/L (ref 135–145)

## 2022-03-23 NOTE — ED Triage Notes (Signed)
Pt arrives with c/o left sided flank pain that has been ongoing for about 2 weeks. Pt was seen for kidney stone before and has not passed them yet. Pt was told to come to ED since he was having worsening pain and has not passed them yet. Pt endorses n/v.  ?

## 2022-03-24 ENCOUNTER — Telehealth: Payer: Self-pay | Admitting: Emergency Medicine

## 2022-03-24 ENCOUNTER — Emergency Department: Payer: 59

## 2022-03-24 ENCOUNTER — Emergency Department
Admission: EM | Admit: 2022-03-24 | Discharge: 2022-03-24 | Disposition: A | Discharge status: Home / Self Care | Payer: 59 | Attending: Emergency Medicine | Admitting: Emergency Medicine

## 2022-03-24 ENCOUNTER — Other Ambulatory Visit: Payer: Self-pay

## 2022-03-24 DIAGNOSIS — N200 Calculus of kidney: Secondary | ICD-10-CM | POA: Diagnosis not present

## 2022-03-24 DIAGNOSIS — R109 Unspecified abdominal pain: Secondary | ICD-10-CM

## 2022-03-24 LAB — URINALYSIS, ROUTINE W REFLEX MICROSCOPIC
Bacteria, UA: NONE SEEN
Bilirubin Urine: NEGATIVE
Glucose, UA: NEGATIVE mg/dL
Ketones, ur: NEGATIVE mg/dL
Leukocytes,Ua: NEGATIVE
Nitrite: NEGATIVE
Protein, ur: NEGATIVE mg/dL
Specific Gravity, Urine: 1.024 (ref 1.005–1.030)
pH: 6 (ref 5.0–8.0)

## 2022-03-24 LAB — BASIC METABOLIC PANEL
Anion gap: 6 (ref 5–15)
BUN: 18 mg/dL (ref 6–20)
CO2: 31 mmol/L (ref 22–32)
Calcium: 9.8 mg/dL (ref 8.9–10.3)
Chloride: 102 mmol/L (ref 98–111)
Creatinine, Ser: 1.74 mg/dL — ABNORMAL HIGH (ref 0.61–1.24)
GFR, Estimated: 56 mL/min — ABNORMAL LOW (ref >60)
Glucose, Bld: 110 mg/dL — ABNORMAL HIGH (ref 70–99)
Potassium: 4.1 mmol/L (ref 3.5–5.1)
Sodium: 139 mmol/L (ref 135–145)

## 2022-03-24 LAB — CBC
HCT: 47.5 % (ref 39.0–52.0)
Hemoglobin: 16.3 g/dL (ref 13.0–17.0)
MCH: 29.6 pg (ref 26.0–34.0)
MCHC: 34.3 g/dL (ref 30.0–36.0)
MCV: 86.4 fL (ref 80.0–100.0)
Platelets: 370 10*3/uL (ref 150–400)
RBC: 5.5 MIL/uL (ref 4.22–5.81)
RDW: 11.9 % (ref 11.5–15.5)
WBC: 9.9 10*3/uL (ref 4.0–10.5)
nRBC: 0 % (ref 0.0–0.2)

## 2022-03-24 MED ORDER — ONDANSETRON HCL 4 MG PO TABS: 4.0000 mg | ORAL_TABLET | Freq: Every day | ORAL | 1 refills | Status: DC | PRN

## 2022-03-24 MED ORDER — SODIUM CHLORIDE 0.9 % IV BOLUS
1000.0000 mL | Freq: Once | INTRAVENOUS | Status: AC
  Administered 2022-03-24: 1000 mL via INTRAVENOUS

## 2022-03-24 MED ORDER — ONDANSETRON HCL 4 MG/2ML IJ SOLN
4.0000 mg | Freq: Once | INTRAMUSCULAR | Status: AC
  Administered 2022-03-24: 4 mg via INTRAVENOUS
  Filled 2022-03-24: qty 2

## 2022-03-24 MED ORDER — TAMSULOSIN HCL 0.4 MG PO CAPS: 0.4000 mg | ORAL_CAPSULE | Freq: Every day | ORAL | 0 refills | Status: AC

## 2022-03-24 MED ORDER — KETOROLAC TROMETHAMINE 15 MG/ML IJ SOLN
15.0000 mg | Freq: Once | INTRAMUSCULAR | Status: AC
  Administered 2022-03-24: 15 mg via INTRAVENOUS
  Filled 2022-03-24: qty 1

## 2022-03-24 NOTE — Telephone Encounter (Signed)
Called patient due to left emergency department before provider exam to inquire about condition and follow up plans. No answer and no VM 

## 2022-03-24 NOTE — ED Triage Notes (Signed)
Pt here with a kidney stone and a kidney infection. Pt appt with urology on Monday but the pain has become unbearable.  ?

## 2022-03-24 NOTE — Discharge Instructions (Addendum)
Your CAT scan demonstrates that the kidney stone has moved closer to your bladder.  You may take the Flomax as prescribed to help with the passage of the stone.  As we discussed, it is very important that you stay very hydrated given your worsening kidney function.  You may take the nausea medication (Zofran) as prescribed (but please do not take more than prescribed as this may precipitate very dangerous and potentially life threatening heart rhythm problems) to help you stay hydrated.  Also, please refrain from using NSAIDs as this is cleared through your kidneys and may worsen the function as well.  Please follow-up with the urologist on Monday as you have scheduled.  Please return the emergency department immediately for any new, worsening, or change in symptoms or other concerns.  It was a pleasure caring for you today. ?

## 2022-03-24 NOTE — ED Provider Notes (Signed)
? ?[facility] ?Provider Note ? ? ? Event Date/Time  ? First MD Initiated Contact with Patient 03/24/22 1549   ?  (approximate) ? ? ?History  ? ?Flank Pain ? ? ?HPI ? ?Jesse Carlson is a 26 y.o. male who presents today for evaluation of flank pain.  Patient was seen in the emergency department on 03/08/2022 and diagnosed with a 4 mm obstructing stone.  Patient reports that his pain has persisted and he has run out of pain medication starting yesterday.  He reports that he has been nauseated but has not had any vomiting.  He denies any fevers or chills.  He reports that the pain has become "unbearable."  He took Tylenol this morning without significant improvement.  He has also been taking Benadryl to help him sleep at night. He has an appointment on 03/27/22 with Dr. Bernardo Heater with urology. ? ?I reviewed the patient's chart.  On his initial presentation on 4/26 his urinalysis demonstrated a mild amount of white blood cells and rare bacteria, therefore urine culture was sent and patient was started on antibiotics as a precaution.  Urine culture resulted 4/26 and demonstrates no growth.  Yesterday's urine was clean without leukocytes or nitrites or bacteria. ? ?I reviewed the patient's blood work.  His labs on 03/09/22 revealed a creatinine of 1.23 and creatinine obtained on 03/23/2022 revealed a creatinine of 1.58. ?  ? ? ?Physical Exam  ? ?Triage Vital Signs: ?ED Triage Vitals  ?Enc Vitals Group  ?   BP 03/24/22 1531 138/86  ?   Pulse Rate 03/24/22 1531 93  ?   Resp 03/24/22 1531 18  ?   Temp 03/24/22 1531 98.8 ?F (37.1 ?C)  ?   Temp Source 03/24/22 1531 Oral  ?   SpO2 03/24/22 1531 100 %  ?   Weight 03/24/22 1531 200 lb (90.7 kg)  ?   Height 03/24/22 1531 5\' 11"  (1.803 m)  ?   Head Circumference --   ?   Peak Flow --   ?   Pain Score 03/24/22 1536 10  ?   Pain Loc --   ?   Pain Edu? --   ?   Excl. in [place]? --   ? ? ?Most recent vital signs: ?Vitals:  ? 03/24/22 1531  ?BP: 138/86  ?Pulse: 93   ?Resp: 18  ?Temp: 98.8 ?F (37.1 ?C)  ?SpO2: 100%  ? ? ?Physical Exam ?Vitals and nursing note reviewed.  ?Constitutional:   ?   General: Awake and alert. No acute distress.  Laying on the stretcher with his eyes closed, answering questions appropriately ?   Appearance: Normal appearance. He is well-developed and normal weight.  ?HENT:  ?   Head: Normocephalic and atraumatic.  ?   Mouth/Throat:  ?   Mouth: Mucous membranes are moist.  ?Eyes:  ?   General: PERRL. Normal EOMs     ?   Right eye: No discharge.     ?   Left eye: No discharge.  ?   Conjunctiva/sclera: Conjunctivae normal.  ?Cardiovascular:  ?   Rate and Rhythm: Normal rate and regular rhythm.  ?   Pulses: Normal pulses.  ?   Heart sounds: Normal heart sounds ?Pulmonary:  ?   Effort: Pulmonary effort is normal. No respiratory distress.  ?   Breath sounds: Normal breath sounds.  ?Abdominal:  ?   Abdomen is soft. There is no abdominal tenderness. No rebound or guarding. No distention. ?Musculoskeletal:     ?  General: No swelling. Normal range of motion.  ?   Cervical back: Normal range of motion and neck supple.  ?Lymphadenopathy:  ?   Cervical: No cervical adenopathy.  ?Skin: ?   General: Skin is warm and dry.  ?   Capillary Refill: Capillary refill takes less than 2 seconds.  ?   Findings: No rash.  ?Neurological:  ?   Mental Status: He is alert.  ?  ? ? ?ED Results / Procedures / Treatments  ? ?Labs ?(all labs ordered are listed, but only abnormal results are displayed) ?Labs Reviewed  ?BASIC METABOLIC PANEL - Abnormal; Notable for the following components:  ?    Result Value  ? Glucose, Bld 110 (*)   ? Creatinine, Ser 1.74 (*)   ? GFR, Estimated 56 (*)   ? All other components within normal limits  ?URINALYSIS, ROUTINE W REFLEX MICROSCOPIC - Abnormal; Notable for the following components:  ? Color, Urine YELLOW (*)   ? APPearance CLEAR (*)   ? Hgb urine dipstick MODERATE (*)   ? All other components within normal limits  ?CBC   ? ? ? ?EKG ? ? ? ? ?RADIOLOGY ?I reviewed patients imaging and agree with findings as documented by radiologist. ? ? ? ?PROCEDURES: ? ?Critical Care performed:  ? ?Procedures ? ? ?MEDICATIONS ORDERED IN ED: ?Medications  ?sodium chloride 0.9 % bolus 1,000 mL (0 mLs Intravenous Stopped 03/24/22 1807)  ?ketorolac (TORADOL) 15 MG/ML injection 15 mg (15 mg Intravenous Given 03/24/22 1642)  ?ondansetron [facility]) injection 4 mg (4 mg Intravenous Given 03/24/22 1702)  ? ? ? ?IMPRESSION / MDM / ASSESSMENT AND PLAN / ED COURSE  ?I reviewed the triage vital signs and the nursing notes. ? ? ?Differential diagnosis includes, but is not limited to, nephrolithiasis, hydronephrosis, pyelonephritis, infected stone, AKI, dehydration.  Patient is awake and alert, hemodynamically stable and afebrile.  Labs and urinalysis were obtained.  Given that his left-sided flank pain has worsened and he has developed new right-sided flank pain, and given the duration of his symptoms, patient agreed to repeat CT renal.  In the meantime he was treated symptomatically with IV fluids and Toradol. ? ?Labs reveal an uptrending creatinine, from 1.27 (4/26) to 1.58 (5/11) to 1.74 today.  Urinalysis does not reveal infection and WBC normal. Patient hydrated with IVF. ? ?CT reveals stone that has moved distally to the UVJ with mild hydronephrosis. Suspect that the AKI is a renal azotemia due to the blocked ureter. Discussed this with Dr. Starleen Blue who feels that outpatient management on Monday as scheduled is reasonable, no emergent procedure indicated. Patient was restarted on flomax and zofran so that he can stay hydrated (reports he has had decreased PO intake due to nausea). Also advised that he not take ibuprofen/NSAIDs. We discussed return precautions. Patient and significant other understand and agree. ? ?Case discussed with Dr. Starleen Blue who agrees with assessment and plan. ? ?Clinical Course as of 03/24/22 2023  ?Fri Mar 24, 2022  ?1720 Patient  re-evaluated, reports pain is significantly improved [JP]  ?  ?Clinical Course User Index ?[JP] Tyde Lamison, Clarnce Flock, PA-C  ? ? ? ?FINAL CLINICAL IMPRESSION(S) / ED DIAGNOSES  ? ?Final diagnoses:  ?Left flank pain  ?Nephrolithiasis  ? ? ? ?Rx / DC Orders  ? ?ED Discharge Orders   ? ?      Ordered  ?  tamsulosin (FLOMAX) 0.4 MG CAPS capsule  Daily       ? 03/24/22 1722  ?  ondansetron (ZOFRAN) 4 MG tablet  Daily PRN       ? 03/24/22 1722  ? ?  ?  ? ?  ? ? ? ?Note:  This document was prepared using Dragon voice recognition software and may include unintentional dictation errors. ?  ?Marquette Old, PA-C ?03/24/22 2023 ? ?  ?Rada Hay, MD ?03/25/22 1424 ? ?

## 2022-03-26 ENCOUNTER — Emergency Department
Admission: EM | Admit: 2022-03-26 | Discharge: 2022-03-27 | Disposition: A | Discharge status: Home / Self Care | Payer: 59 | Attending: Emergency Medicine | Admitting: Emergency Medicine

## 2022-03-26 ENCOUNTER — Other Ambulatory Visit: Payer: Self-pay

## 2022-03-26 DIAGNOSIS — N200 Calculus of kidney: Secondary | ICD-10-CM | POA: Insufficient documentation

## 2022-03-26 DIAGNOSIS — N179 Acute kidney failure, unspecified: Secondary | ICD-10-CM | POA: Insufficient documentation

## 2022-03-26 DIAGNOSIS — R109 Unspecified abdominal pain: Secondary | ICD-10-CM | POA: Diagnosis present

## 2022-03-26 LAB — URINALYSIS, ROUTINE W REFLEX MICROSCOPIC
Bacteria, UA: NONE SEEN
Bilirubin Urine: NEGATIVE
Glucose, UA: NEGATIVE mg/dL
Ketones, ur: NEGATIVE mg/dL
Leukocytes,Ua: NEGATIVE
Nitrite: NEGATIVE
Protein, ur: NEGATIVE mg/dL
Specific Gravity, Urine: 1.02 (ref 1.005–1.030)
Squamous Epithelial / HPF: NONE SEEN (ref 0–5)
pH: 7 (ref 5.0–8.0)

## 2022-03-26 LAB — CBC
HCT: 46.6 % (ref 39.0–52.0)
Hemoglobin: 16.2 g/dL (ref 13.0–17.0)
MCH: 29.6 pg (ref 26.0–34.0)
MCHC: 34.8 g/dL (ref 30.0–36.0)
MCV: 85.2 fL (ref 80.0–100.0)
Platelets: 421 10*3/uL — ABNORMAL HIGH (ref 150–400)
RBC: 5.47 MIL/uL (ref 4.22–5.81)
RDW: 11.8 % (ref 11.5–15.5)
WBC: 9.1 10*3/uL (ref 4.0–10.5)
nRBC: 0 % (ref 0.0–0.2)

## 2022-03-26 LAB — BASIC METABOLIC PANEL
Anion gap: 12 (ref 5–15)
BUN: 20 mg/dL (ref 6–20)
CO2: 29 mmol/L (ref 22–32)
Calcium: 10.4 mg/dL — ABNORMAL HIGH (ref 8.9–10.3)
Chloride: 96 mmol/L — ABNORMAL LOW (ref 98–111)
Creatinine, Ser: 1.88 mg/dL — ABNORMAL HIGH (ref 0.61–1.24)
GFR, Estimated: 51 mL/min — ABNORMAL LOW (ref >60)
Glucose, Bld: 114 mg/dL — ABNORMAL HIGH (ref 70–99)
Potassium: 3.7 mmol/L (ref 3.5–5.1)
Sodium: 137 mmol/L (ref 135–145)

## 2022-03-26 MED ORDER — OXYCODONE HCL 5 MG PO TABS: 5.0000 mg | ORAL_TABLET | Freq: Four times a day (QID) | ORAL | 0 refills | Status: DC | PRN

## 2022-03-26 MED ORDER — ACETAMINOPHEN 500 MG PO TABS
1000.0000 mg | ORAL_TABLET | Freq: Once | ORAL | Status: AC
Start: 2022-03-26 — End: 2022-03-26
  Administered 2022-03-26: 1000 mg via ORAL
  Filled 2022-03-26: qty 2

## 2022-03-26 MED ORDER — ONDANSETRON HCL 4 MG/2ML IJ SOLN
4.0000 mg | Freq: Once | INTRAMUSCULAR | Status: AC
  Administered 2022-03-26: 4 mg via INTRAVENOUS
  Filled 2022-03-26: qty 2

## 2022-03-26 MED ORDER — MORPHINE SULFATE (PF) 4 MG/ML IV SOLN
6.0000 mg | Freq: Once | INTRAVENOUS | Status: AC
  Administered 2022-03-26: 6 mg via INTRAVENOUS
  Filled 2022-03-26: qty 2

## 2022-03-26 MED ORDER — SODIUM CHLORIDE 0.9 % IV BOLUS
1000.0000 mL | Freq: Once | INTRAVENOUS | Status: AC
  Administered 2022-03-26: 1000 mL via INTRAVENOUS

## 2022-03-26 MED ORDER — ONDANSETRON 4 MG PO TBDP
4.0000 mg | ORAL_TABLET | Freq: Three times a day (TID) | ORAL | 0 refills | Status: DC | PRN
Start: 2022-03-26 — End: 2022-05-03

## 2022-03-26 MED ORDER — OXYCODONE HCL 5 MG PO TABS
5.0000 mg | ORAL_TABLET | Freq: Once | ORAL | Status: AC
  Administered 2022-03-27: 5 mg via ORAL
  Filled 2022-03-26: qty 1

## 2022-03-26 MED ORDER — MORPHINE SULFATE (PF) 4 MG/ML IV SOLN
4.0000 mg | Freq: Once | INTRAVENOUS | Status: AC
  Administered 2022-03-26: 4 mg via INTRAVENOUS
  Filled 2022-03-26: qty 1

## 2022-03-26 MED ORDER — KETOROLAC TROMETHAMINE 30 MG/ML IJ SOLN
15.0000 mg | Freq: Once | INTRAMUSCULAR | Status: DC
  Filled 2022-03-26: qty 1

## 2022-03-26 MED ORDER — ONDANSETRON HCL 4 MG/2ML IJ SOLN
4.0000 mg | Freq: Once | INTRAMUSCULAR | Status: AC
  Administered 2022-03-27: 4 mg via INTRAVENOUS
  Filled 2022-03-26: qty 2

## 2022-03-26 MED ORDER — LACTATED RINGERS IV BOLUS
1000.0000 mL | Freq: Once | INTRAVENOUS | Status: AC
Start: 2022-03-26 — End: 2022-03-26
  Administered 2022-03-26: 1000 mL via INTRAVENOUS

## 2022-03-26 NOTE — Discharge Instructions (Signed)
Follow up with your urologist tomorrow 03/27/22. ?

## 2022-03-26 NOTE — ED Notes (Addendum)
Pt c/o right flank pain. Pt states he been recently diagnosed with a kidney stone and has an appointment in the morning but pain was unbearable. Pt also c/o of constant NV that started today as well.  ?

## 2022-03-26 NOTE — ED Provider Notes (Signed)
? ?[facility] ?Provider Note ? ? ? Event Date/Time  ? First MD Initiated Contact with Patient 03/26/22 2103   ?  (approximate) ? ? ?History  ? ?Flank Pain ? ? ?HPI ? ?Jesse Carlson is a 26 y.o. male  here with left flank pain. Pt has had ongoing L flank pain from 4 mm stone for the last several weeks. He has been seen multiple times for this, but has had ongoing pain. He states he's been trying otc meds, zofran, and flomax w/o relief. Has been unable to keep anything down over past 24 hours so presents for evaluation. Initially reported subjective fevers but this sounds more so like feeling flushed with pain, nothing over 100.80F. No blood in emesis. Pain is along L flank, worse with movement. No alleviating factors. Has never had lithotripsy or stents. Has an appt with Dr. Lonna Cobb tomorrow.  ? ?  ? ? ?Physical Exam  ? ?Triage Vital Signs: ?ED Triage Vitals  ?Enc Vitals Group  ?   BP 03/26/22 2039 137/86  ?   Pulse Rate 03/26/22 2039 88  ?   Resp 03/26/22 2039 17  ?   Temp 03/26/22 2039 97.7 ?F (36.5 ?C)  ?   Temp Source 03/26/22 2039 Oral  ?   SpO2 03/26/22 2039 100 %  ?   Weight 03/26/22 2040 200 lb (90.7 kg)  ?   Height 03/26/22 2040 5\' 11"  (1.803 m)  ?   Head Circumference --   ?   Peak Flow --   ?   Pain Score 03/26/22 2040 10  ?   Pain Loc --   ?   Pain Edu? --   ?   Excl. in [place]? --   ? ? ?Most recent vital signs: ?Vitals:  ? 03/27/22 0000 03/27/22 0030  ?BP: 134/87 136/85  ?Pulse: 80 73  ?Resp: 17 17  ?Temp: 98 ?F (36.7 ?C) 98.1 ?F (36.7 ?C)  ?SpO2: 100% 100%  ? ? ? ?General: Awake, appears uncomfortable/in pain. ?CV:  Good peripheral perfusion. Regular rate and rhythm. ?Resp:  Normal effort. Lungs CTAB. ?Abd:  No distention. Diffuse mild L sided ttp. ?Other:  Dry MM. ? ? ?ED Results / Procedures / Treatments  ? ?Labs ?(all labs ordered are listed, but only abnormal results are displayed) ?Labs Reviewed  ?URINALYSIS, ROUTINE W REFLEX MICROSCOPIC - Abnormal; Notable for the  following components:  ?    Result Value  ? Color, Urine YELLOW (*)   ? APPearance CLEAR (*)   ? Hgb urine dipstick SMALL (*)   ? All other components within normal limits  ?BASIC METABOLIC PANEL - Abnormal; Notable for the following components:  ? Chloride 96 (*)   ? Glucose, Bld 114 (*)   ? Creatinine, Ser 1.88 (*)   ? Calcium 10.4 (*)   ? GFR, Estimated 51 (*)   ? All other components within normal limits  ?CBC - Abnormal; Notable for the following components:  ? Platelets 421 (*)   ? All other components within normal limits  ?URINE CULTURE  ? ? ? ?EKG ? ? ? ?RADIOLOGY ?Reviewed recent ct 5/12 showing 0.4 cm stone left UVJ. ? ? ?I also independently reviewed and agree with radiologist interpretations. ? ? ?PROCEDURES: ? ?Critical Care performed: No ? ? ?MEDICATIONS ORDERED IN ED: ?Medications  ?lactated ringers bolus 1,000 mL (0 mLs Intravenous Stopped 03/26/22 2313)  ?ondansetron [facility]) injection 4 mg (4 mg Intravenous Given 03/26/22 2117)  ?morphine (PF)  4 MG/ML injection 6 mg (6 mg Intravenous Given 03/26/22 2119)  ?sodium chloride 0.9 % bolus 1,000 mL (0 mLs Intravenous Stopped 03/26/22 2313)  ?acetaminophen (TYLENOL) tablet 1,000 mg (1,000 mg Oral Given 03/26/22 2136)  ?morphine (PF) 4 MG/ML injection 4 mg (4 mg Intravenous Given 03/26/22 2314)  ?oxyCODONE (Oxy IR/ROXICODONE) immediate release tablet 5 mg (5 mg Oral Given 03/27/22 0008)  ?ondansetron [facility]) injection 4 mg (4 mg Intravenous Given 03/27/22 0006)  ?promethazine (PHENERGAN) tablet 25 mg (25 mg Oral Given 03/27/22 0053)  ? ? ? ?IMPRESSION / MDM / ASSESSMENT AND PLAN / ED COURSE  ?I reviewed the triage vital signs and the nursing notes. ?             ?               ? ? ?The patient is on the cardiac monitor to evaluate for evidence of arrhythmia and/or significant heart rate changes. ? ? ?Ddx:  ?Differential includes the following, with pertinent life- or limb-threatening emergencies considered: ? ?Renal colic, infected obstructive stone,  pyelonephritis, MSK flank pain ? ? ?MDM:  ?26 yo M with known obstructive stone here with ongoing flank pain, n/v. Pt has been taking OTC analgesics but has not had opiates at home. On arrival, pt appears uncomfortable but is nontoxic. Afebrile. CBC without leukocytosis. BMP does show increasing Cr which I suspect is combination of dehydration and NSAID use - will advise him to hold NSAIDs and continue hydration. UA without signs of infection.  ? ?Pt given IVF, analgesia with improvement in sx. He is tolerating PO currently. If pain remains controlled, pt has a Urology appt this morning, which is ideal given the duration of his sx. Rx for better analgesia at home sent, with antiemetics. Advised him to hold NSAIDs for now. ? ? ?MEDICATIONS GIVEN IN ED: ?Medications  ?lactated ringers bolus 1,000 mL (0 mLs Intravenous Stopped 03/26/22 2313)  ?ondansetron [facility]) injection 4 mg (4 mg Intravenous Given 03/26/22 2117)  ?morphine (PF) 4 MG/ML injection 6 mg (6 mg Intravenous Given 03/26/22 2119)  ?sodium chloride 0.9 % bolus 1,000 mL (0 mLs Intravenous Stopped 03/26/22 2313)  ?acetaminophen (TYLENOL) tablet 1,000 mg (1,000 mg Oral Given 03/26/22 2136)  ?morphine (PF) 4 MG/ML injection 4 mg (4 mg Intravenous Given 03/26/22 2314)  ?oxyCODONE (Oxy IR/ROXICODONE) immediate release tablet 5 mg (5 mg Oral Given 03/27/22 0008)  ?ondansetron [facility]) injection 4 mg (4 mg Intravenous Given 03/27/22 0006)  ?promethazine (PHENERGAN) tablet 25 mg (25 mg Oral Given 03/27/22 0053)  ? ? ? ?Consults:  ?None ? ? ?EMR reviewed  ?Prior ED visits, CT scans  ? ? ? ? ?FINAL CLINICAL IMPRESSION(S) / ED DIAGNOSES  ? ?Final diagnoses:  ?Kidney stone  ?AKI (acute kidney injury) (HCC)  ? ? ? ?Rx / DC Orders  ? ?ED Discharge Orders   ? ?      Ordered  ?  oxyCODONE (ROXICODONE) 5 MG immediate release tablet  Every 6 hours PRN       ? 03/26/22 2345  ?  ondansetron (ZOFRAN-ODT) 4 MG disintegrating tablet  Every 8 hours PRN       ? 03/26/22 2345  ? ?  ?  ? ?   ? ? ? ?Note:  This document was prepared using Dragon voice recognition software and may include unintentional dictation errors. ?  ?Shaune Pollack, MD ?03/27/22 0131 ? ?

## 2022-03-26 NOTE — ED Triage Notes (Signed)
Pt was seen here 2 days ago and diagnosed with kidney stones. Pt states he was trying to wait for his appt in the morning with urology but the pain has gotten much worse. Pt has been vomiting and now has a fever.  ?

## 2022-03-26 NOTE — ED Notes (Signed)
Pt unable to urinate at this time  ?Bladder scan 87ml  ? ?

## 2022-03-27 ENCOUNTER — Encounter: Payer: Self-pay | Admitting: Urology

## 2022-03-27 ENCOUNTER — Ambulatory Visit (INDEPENDENT_AMBULATORY_CARE_PROVIDER_SITE_OTHER): Payer: 59 | Admitting: Urology

## 2022-03-27 ENCOUNTER — Other Ambulatory Visit: Payer: Self-pay | Admitting: Urology

## 2022-03-27 ENCOUNTER — Telehealth: Payer: Self-pay

## 2022-03-27 VITALS — BP 138/86 | HR 77 | Ht 71.0 in | Wt 200.0 lb

## 2022-03-27 DIAGNOSIS — N200 Calculus of kidney: Secondary | ICD-10-CM | POA: Diagnosis not present

## 2022-03-27 DIAGNOSIS — N132 Hydronephrosis with renal and ureteral calculous obstruction: Secondary | ICD-10-CM

## 2022-03-27 DIAGNOSIS — N201 Calculus of ureter: Secondary | ICD-10-CM

## 2022-03-27 DIAGNOSIS — N23 Unspecified renal colic: Secondary | ICD-10-CM | POA: Diagnosis not present

## 2022-03-27 LAB — URINALYSIS, COMPLETE
Bilirubin, UA: NEGATIVE
Glucose, UA: NEGATIVE
Leukocytes,UA: NEGATIVE
Nitrite, UA: NEGATIVE
Specific Gravity, UA: 1.02 (ref 1.005–1.030)
Urobilinogen, Ur: 0.2 mg/dL (ref 0.2–1.0)
pH, UA: 7 (ref 5.0–7.5)

## 2022-03-27 LAB — MICROSCOPIC EXAMINATION

## 2022-03-27 MED ORDER — PROMETHAZINE HCL 25 MG PO TABS
25.0000 mg | ORAL_TABLET | Freq: Once | ORAL | Status: AC
  Administered 2022-03-27: 25 mg via ORAL
  Filled 2022-03-27: qty 1

## 2022-03-27 MED ORDER — CEFAZOLIN SODIUM-DEXTROSE 2-4 GM/100ML-% IV SOLN
2.0000 g | INTRAVENOUS | Status: AC
  Administered 2022-03-28: 2 g via INTRAVENOUS

## 2022-03-27 NOTE — ED Notes (Signed)
Pt was given graham cracker and apple juice. Pt tolerated well with no c/o nausea or vomiting. ?

## 2022-03-27 NOTE — Progress Notes (Signed)
[facility] Posting Form  ? ?Surgery Date/Time: Date: 03/28/2022 ? ?Surgeon: Dr. Irineo Axon, MD ? ?Surgery Location: Day Surgery ? ?Inpt ( No  )   Outpt (Yes)   Obs ( No  )  ? ?Diagnosis: N20.1 Left Ureteral Stone ? ?-CPT: 52356 ? ?Surgery: Left Ureteroscopy with laser lithotripsy and stent placement ? ?Stop Anticoagulations: N/A ? ?Cardiac/Medical/Pulmonary Clearance needed: no ? ?*Orders entered into EPIC  Date: 03/27/22  ? ?*Case booked in [place]  Date: 03/27/22 ? ?*Notified pt of Surgery: Date: 03/27/22 ? ?PRE-OP UA & CX: no ? ?*Placed into Prior Authorization Work Que Date: 03/27/22 ? ? ?Assistant/laser/rep:No ? ? ? ? ? ? ? ? ? ? ? ? ? ? ? ?

## 2022-03-27 NOTE — Progress Notes (Signed)
? ?03/27/2022 ?8:47 AM  ? ?Jesse Carlson ?08-13-1996 ?[id number] ? ?Referring provider: No referring provider defined for this encounter. ? ?Chief Complaint  ?Patient presents with  ? Nephrolithiasis  ? ? ?HPI: ?Jesse Carlson is a 26 y.o. male who presents in follow-up for a recent ED visit for renal colic.  He presents today with his friend. ? ?Initially presented to [facility] ED 03/08/2022 with complaints of left flank pain ?CT with a 4 mm left proximal ureteral calculus with mild hydronephrosis and multiple bilateral, nonobstructing renal calculi ?Repeat ED visits 4/27, 5/12 and 5/14 for flank pain after running out of pain medication.  Repeat CT 03/24/2022 showed migration of the left ureteral calculus to the UVJ ?Positive nausea/vomiting ?No fever or chills ?Creatinine yesterday evening 1.88 ?Minimal pain at present but somnolent from recent ED visit earlier this morning ?No prior history stone disease ? ? ?PMH: ?Past Medical History:  ?Diagnosis Date  ? Kidney calculus   ? ? ?Surgical History: ?No past surgical history on file. ? ?Home Medications:  ?Allergies as of 03/27/2022   ?No Known Allergies ?  ? ?  ?Medication List  ?  ? ?  ? Accurate as of Mar 27, 2022  8:47 AM. If you have any questions, ask your nurse or doctor.  ?  ?  ? ?  ? ?ondansetron 4 MG disintegrating tablet ?Commonly known as: ZOFRAN-ODT ?Take 1 tablet (4 mg total) by mouth every 8 (eight) hours as needed for nausea or vomiting. ?  ?oxyCODONE 5 MG immediate release tablet ?Commonly known as: Roxicodone ?Take 1-2 tablets (5-10 mg total) by mouth every 6 (six) hours as needed for severe pain or moderate pain (no more than 6 tabs per day). ?  ?tamsulosin 0.4 MG Caps capsule ?Commonly known as: FLOMAX ?Take 1 capsule (0.4 mg total) by mouth daily for 5 days. ?  ? ?  ? ? ?Allergies: No Known Allergies ? ?Family History: ?No family history on file. ? ?Social History:  reports that he has never smoked. He uses smokeless tobacco. He reports that  he does not drink alcohol and does not use drugs. ? ? ?Physical Exam: ?BP 138/86   Pulse 77   Ht 5\' 11"  (1.803 m)   Wt 200 lb (90.7 kg)   BMI 27.89 kg/m?   ?Constitutional:  Alert and oriented, No acute distress. ?HEENT: [place] AT, moist mucus membranes.  Trachea midline, no masses. ?Cardiovascular: RRR ?Respiratory: Normal respiratory effort, no increased work of breathing. ?GI: Abdomen is soft, nontender, nondistended, no abdominal masses ?GU: No CVA tenderness ?Skin: No rashes, bruises or suspicious lesions. ?Neurologic: Grossly intact, no focal deficits, moving all 4 extremities. ?Psychiatric: Normal mood and affect. ? ?Laboratory Data: ? ?Urinalysis ?UA ED 11-20 RBC/negative WBC ? ? ?Pertinent Imaging: ?CT images were personally reviewed and interpreted.  I do not see significant left-sided nephrolithiasis ? ?CT Renal Stone Study ? ?Narrative ?CLINICAL DATA:  Left-sided flank pain initially now with right-sided ?flank pain. Urology appointment on Monday. ? ?EXAM: ?CT ABDOMEN AND PELVIS WITHOUT CONTRAST ? ?TECHNIQUE: ?Multidetector CT imaging of the abdomen and pelvis was performed ?following the standard protocol without IV contrast. ? ?RADIATION DOSE REDUCTION: This exam was performed according to the ?departmental dose-optimization program which includes automated ?exposure control, adjustment of the mA and/or kV according to ?patient size and/or use of iterative reconstruction technique. ? ?COMPARISON:  03/08/2022 ? ?FINDINGS: ?Lower chest: Lung bases are normal. ? ?Hepatobiliary: Gallbladder, liver and biliary tree are normal. ? ?Pancreas:  Normal. ? ?Spleen: Normal. ? ?Adrenals/Urinary Tract: Adrenal glands are normal. Kidneys are ?normal in size. Several punctate right renal stones unchanged which ?are nonobstructive. Right ureter is normal. Persistent mild ?left-sided hydronephrosis. Few punctate calcifications along the ?corticomedullary junction of the left kidney. Previous seen proximal ?left ureteral  stone has moved distally and now is located over the ?distal ureter just above the UVJ. Bladder is otherwise unremarkable. ? ?Stomach/Bowel: Stomach and small bowel are normal. Appendix is ?normal. Colon is normal. ? ?Vascular/Lymphatic: Abdominal aorta is normal caliber. Remaining ?vascular structures are unremarkable. No adenopathy. ? ?Reproductive: Normal. ? ?Other: No free fluid or focal inflammatory change. ? ?Musculoskeletal: No focal abnormality. ? ?IMPRESSION: ?1. Previously seen proximal left ureteral stone has moved distally ?and now is located over the distal ureter just above the UVJ. ?Persistent mild left-sided hydronephrosis. ?2. Bilateral nephrolithiasis. ? ? ?Electronically Signed ?By: Marin Olp M.D. ?On: 03/24/2022 16:34 ? ? ?Assessment & Plan:   ? ?1.  Left distal ureteral calculus ?Multiple ED visits for recurrent pain ?We discussed various treatment options for urolithiasis including observation with or without medical expulsive therapy, shockwave lithotripsy (SWL), ureteroscopy and laser lithotripsy with stent placement. ?We discussed that management is based on stone size, location, density, patient co-morbidities, and patient preference.  ?Stones <51mm in size have a >80% spontaneous passage rate. Data surrounding the use of tamsulosin for medical expulsive therapy is controversial, but meta analyses suggests it is most efficacious for distal stones between 5-37mm in size. Possible side effects include dizziness/lightheadedness, and retrograde ejaculation. ?SWL has a lower stone free rate in a single procedure, but also a lower complication rate compared to ureteroscopy and avoids a stent and associated stent related symptoms. Possible complications include renal hematoma, steinstrasse, and need for additional treatment. ?Ureteroscopy with laser lithotripsy and stent placement has a higher stone free rate than SWL in a single procedure, however increased complication rate including possible  infection, ureteral injury, bleeding, and stent related morbidity. Common stent related symptoms include dysuria, urgency/frequency, and flank pain. ?After an extensive discussion of the risks and benefits of the above treatment options, the patient would like to proceed with ureteroscopy and will add onto scheduled tomorrow. ? ? ? ?Abbie Sons, MD ? ?[facility] ?[address] ?[place], [place] [address] ?[phone] ? ?

## 2022-03-27 NOTE — ED Notes (Signed)
Pt stated he feels better.  ?

## 2022-03-27 NOTE — Progress Notes (Signed)
Surgical Physician Order Form [facility] ? ?* Scheduling expectation :  03/28/2022 ? ?*Length of Case: 30 minutes ? ?*Clearance needed: no ? ?*Anticoagulation Instructions: N/A ? ?*Aspirin Instructions: N/A ? ?*Post-op visit Date/Instructions:  1 month follow up ? ?*Diagnosis: Left Ureteral Stone ? ?*Procedure: Left Ureteroscopy w/laser lithotripsy & stent placement (67341) ? ? ?Additional orders: N/A ? ?-Admit type: OUTpatient ? ?-Anesthesia: Choice ? ?-VTE Prophylaxis Standing Order SCD?s    ?   ?Other:  ? ?-Standing Lab Orders Per Anesthesia   ? ?Lab other: None ? ?-Standing Test orders EKG/Chest x-ray per Anesthesia      ? ?Test other:  ? ?- Medications:  Ancef 2gm IV ? ?-Other orders:  N/A ? ? ? ?  ? ?

## 2022-03-27 NOTE — ED Notes (Signed)
EDP made aware of patient having 1 episode of vomiting as he was being wheeled out. Per EDP will order 1 dose PO phenergan. EDP made aware patient requesting to stay in room overnight until F/U appt at 0800 5/15. Primary RN made aware of orders for PO phenergan at this time.  ?

## 2022-03-27 NOTE — Telephone Encounter (Signed)
I spoke with Jesse Carlson. We have discussed possible surgery dates and Tuesday May 16th was agreed upon by all parties. Patient given information about surgery date, what to expect pre-operatively and post operatively.  ?We discussed that a Pre-Admission Testing office will be calling to set up the pre-op visit that will take place prior to surgery, and that these appointments are typically done over the phone with a Pre-Admissions RN.  ?Informed patient that our office will communicate any additional care to be provided after surgery. Patients questions or concerns were discussed during our call. Advised to call our office should there be any additional information, questions or concerns that arise. Patient verbalized understanding.  ? ?

## 2022-03-27 NOTE — H&P (View-Only) (Signed)
? ?03/27/2022 ?8:47 AM  ? ?Jesse Carlson ?12/31/1995 ?[id number] ? ?Referring provider: No referring provider defined for this encounter. ? ?Chief Complaint  ?Patient presents with  ? Nephrolithiasis  ? ? ?HPI: ?Jesse Carlson is a 25 y.o. male who presents in follow-up for a recent ED visit for renal colic.  He presents today with his friend. ? ?Initially presented to [facility] ED 03/08/2022 with complaints of left flank pain ?CT with a 4 mm left proximal ureteral calculus with mild hydronephrosis and multiple bilateral, nonobstructing renal calculi ?Repeat ED visits 4/27, 5/12 and 5/14 for flank pain after running out of pain medication.  Repeat CT 03/24/2022 showed migration of the left ureteral calculus to the UVJ ?Positive nausea/vomiting ?No fever or chills ?Creatinine yesterday evening 1.88 ?Minimal pain at present but somnolent from recent ED visit earlier this morning ?No prior history stone disease ? ? ?PMH: ?Past Medical History:  ?Diagnosis Date  ? Kidney calculus   ? ? ?Surgical History: ?No past surgical history on file. ? ?Home Medications:  ?Allergies as of 03/27/2022   ?No Known Allergies ?  ? ?  ?Medication List  ?  ? ?  ? Accurate as of Mar 27, 2022  8:47 AM. If you have any questions, ask your nurse or doctor.  ?  ?  ? ?  ? ?ondansetron 4 MG disintegrating tablet ?Commonly known as: ZOFRAN-ODT ?Take 1 tablet (4 mg total) by mouth every 8 (eight) hours as needed for nausea or vomiting. ?  ?oxyCODONE 5 MG immediate release tablet ?Commonly known as: Roxicodone ?Take 1-2 tablets (5-10 mg total) by mouth every 6 (six) hours as needed for severe pain or moderate pain (no more than 6 tabs per day). ?  ?tamsulosin 0.4 MG Caps capsule ?Commonly known as: FLOMAX ?Take 1 capsule (0.4 mg total) by mouth daily for 5 days. ?  ? ?  ? ? ?Allergies: No Known Allergies ? ?Family History: ?No family history on file. ? ?Social History:  reports that he has never smoked. He uses smokeless tobacco. He reports that  he does not drink alcohol and does not use drugs. ? ? ?Physical Exam: ?BP 138/86   Pulse 77   Ht 5' 11" (1.803 m)   Wt 200 lb (90.7 kg)   BMI 27.89 kg/m?   ?Constitutional:  Alert and oriented, No acute distress. ?HEENT: [place] AT, moist mucus membranes.  Trachea midline, no masses. ?Cardiovascular: RRR ?Respiratory: Normal respiratory effort, no increased work of breathing. ?GI: Abdomen is soft, nontender, nondistended, no abdominal masses ?GU: No CVA tenderness ?Skin: No rashes, bruises or suspicious lesions. ?Neurologic: Grossly intact, no focal deficits, moving all 4 extremities. ?Psychiatric: Normal mood and affect. ? ?Laboratory Data: ? ?Urinalysis ?UA ED 11-20 RBC/negative WBC ? ? ?Pertinent Imaging: ?CT images were personally reviewed and interpreted.  I do not see significant left-sided nephrolithiasis ? ?CT Renal Stone Study ? ?Narrative ?CLINICAL DATA:  Left-sided flank pain initially now with right-sided ?flank pain. Urology appointment on Monday. ? ?EXAM: ?CT ABDOMEN AND PELVIS WITHOUT CONTRAST ? ?TECHNIQUE: ?Multidetector CT imaging of the abdomen and pelvis was performed ?following the standard protocol without IV contrast. ? ?RADIATION DOSE REDUCTION: This exam was performed according to the ?departmental dose-optimization program which includes automated ?exposure control, adjustment of the mA and/or kV according to ?patient size and/or use of iterative reconstruction technique. ? ?COMPARISON:  03/08/2022 ? ?FINDINGS: ?Lower chest: Lung bases are normal. ? ?Hepatobiliary: Gallbladder, liver and biliary tree are normal. ? ?Pancreas:   Normal. ? ?Spleen: Normal. ? ?Adrenals/Urinary Tract: Adrenal glands are normal. Kidneys are ?normal in size. Several punctate right renal stones unchanged which ?are nonobstructive. Right ureter is normal. Persistent mild ?left-sided hydronephrosis. Few punctate calcifications along the ?corticomedullary junction of the left kidney. Previous seen proximal ?left ureteral  stone has moved distally and now is located over the ?distal ureter just above the UVJ. Bladder is otherwise unremarkable. ? ?Stomach/Bowel: Stomach and small bowel are normal. Appendix is ?normal. Colon is normal. ? ?Vascular/Lymphatic: Abdominal aorta is normal caliber. Remaining ?vascular structures are unremarkable. No adenopathy. ? ?Reproductive: Normal. ? ?Other: No free fluid or focal inflammatory change. ? ?Musculoskeletal: No focal abnormality. ? ?IMPRESSION: ?1. Previously seen proximal left ureteral stone has moved distally ?and now is located over the distal ureter just above the UVJ. ?Persistent mild left-sided hydronephrosis. ?2. Bilateral nephrolithiasis. ? ? ?Electronically Signed ?By: Daniel  Boyle M.D. ?On: 03/24/2022 16:34 ? ? ?Assessment & Plan:   ? ?1.  Left distal ureteral calculus ?Multiple ED visits for recurrent pain ?We discussed various treatment options for urolithiasis including observation with or without medical expulsive therapy, shockwave lithotripsy (SWL), ureteroscopy and laser lithotripsy with stent placement. ?We discussed that management is based on stone size, location, density, patient co-morbidities, and patient preference.  ?Stones <5mm in size have a >80% spontaneous passage rate. Data surrounding the use of tamsulosin for medical expulsive therapy is controversial, but meta analyses suggests it is most efficacious for distal stones between 5-10mm in size. Possible side effects include dizziness/lightheadedness, and retrograde ejaculation. ?SWL has a lower stone free rate in a single procedure, but also a lower complication rate compared to ureteroscopy and avoids a stent and associated stent related symptoms. Possible complications include renal hematoma, steinstrasse, and need for additional treatment. ?Ureteroscopy with laser lithotripsy and stent placement has a higher stone free rate than SWL in a single procedure, however increased complication rate including possible  infection, ureteral injury, bleeding, and stent related morbidity. Common stent related symptoms include dysuria, urgency/frequency, and flank pain. ?After an extensive discussion of the risks and benefits of the above treatment options, the patient would like to proceed with ureteroscopy and will add onto scheduled tomorrow. ? ? ? ?Samanthajo Payano C Tigerlily Christine, MD ? ?[facility] ?[address], [address] ?[place], [place] [address] ?[phone] ? ?

## 2022-03-27 NOTE — ED Notes (Signed)
Pt verbalized understanding of discharge instructions, prescriptions, and follow-up care instructions.  

## 2022-03-28 ENCOUNTER — Ambulatory Visit: Payer: 59 | Admitting: Anesthesiology

## 2022-03-28 ENCOUNTER — Ambulatory Visit
Admission: RE | Admit: 2022-03-28 | Discharge: 2022-03-28 | Disposition: A | Discharge status: Home / Self Care | Payer: 59 | Attending: Urology | Admitting: Urology

## 2022-03-28 ENCOUNTER — Encounter
Admission: RE | Disposition: A | Discharge status: Home / Self Care | Payer: Self-pay | Source: Home / Self Care | Attending: Urology

## 2022-03-28 ENCOUNTER — Encounter: Payer: Self-pay | Admitting: Urology

## 2022-03-28 ENCOUNTER — Ambulatory Visit: Payer: 59

## 2022-03-28 DIAGNOSIS — N201 Calculus of ureter: Secondary | ICD-10-CM | POA: Diagnosis present

## 2022-03-28 HISTORY — PX: CYSTOSCOPY/URETEROSCOPY/HOLMIUM LASER/STENT PLACEMENT: SHX6546

## 2022-03-28 HISTORY — PX: CYSTOSCOPY W/ RETROGRADES: SHX1426

## 2022-03-28 LAB — URINE CULTURE: Culture: NO GROWTH

## 2022-03-28 SURGERY — CYSTOSCOPY/URETEROSCOPY/HOLMIUM LASER/STENT PLACEMENT
Anesthesia: General | Laterality: Left

## 2022-03-28 MED ORDER — CHLORHEXIDINE GLUCONATE 0.12 % MT SOLN: 15.0000 mL | Freq: Once | OROMUCOSAL | Status: AC

## 2022-03-28 MED ORDER — LIDOCAINE HCL (CARDIAC) PF 100 MG/5ML IV SOSY
PREFILLED_SYRINGE | INTRAVENOUS | Status: DC | PRN
  Administered 2022-03-28: 100 mg via INTRAVENOUS

## 2022-03-28 MED ORDER — SODIUM CHLORIDE 0.9 % IR SOLN
Status: DC | PRN
  Administered 2022-03-28: 3000 mL

## 2022-03-28 MED ORDER — PROMETHAZINE HCL 25 MG/ML IJ SOLN: 6.2500 mg | INTRAMUSCULAR | Status: DC | PRN

## 2022-03-28 MED ORDER — MIDAZOLAM HCL 2 MG/2ML IJ SOLN
INTRAMUSCULAR | Status: AC
  Filled 2022-03-28: qty 2

## 2022-03-28 MED ORDER — KETOROLAC TROMETHAMINE 30 MG/ML IJ SOLN
INTRAMUSCULAR | Status: DC | PRN
Start: 2022-03-28 — End: 2022-03-28
  Administered 2022-03-28: 30 mg via INTRAVENOUS

## 2022-03-28 MED ORDER — FENTANYL CITRATE (PF) 100 MCG/2ML IJ SOLN
INTRAMUSCULAR | Status: DC | PRN
  Administered 2022-03-28: 100 ug via INTRAVENOUS

## 2022-03-28 MED ORDER — ONDANSETRON HCL 4 MG/2ML IJ SOLN
INTRAMUSCULAR | Status: DC | PRN
  Administered 2022-03-28: 4 mg via INTRAVENOUS

## 2022-03-28 MED ORDER — LACTATED RINGERS IV SOLN: INTRAVENOUS | Status: DC

## 2022-03-28 MED ORDER — PROPOFOL 10 MG/ML IV BOLUS
INTRAVENOUS | Status: DC | PRN
  Administered 2022-03-28: 200 mg via INTRAVENOUS

## 2022-03-28 MED ORDER — ORAL CARE MOUTH RINSE: 15.0000 mL | Freq: Once | OROMUCOSAL | Status: AC

## 2022-03-28 MED ORDER — DEXAMETHASONE SODIUM PHOSPHATE 10 MG/ML IJ SOLN
INTRAMUSCULAR | Status: DC | PRN
  Administered 2022-03-28: 10 mg via INTRAVENOUS

## 2022-03-28 MED ORDER — FENTANYL CITRATE (PF) 100 MCG/2ML IJ SOLN
INTRAMUSCULAR | Status: AC
  Filled 2022-03-28: qty 2

## 2022-03-28 MED ORDER — CEFAZOLIN SODIUM-DEXTROSE 2-4 GM/100ML-% IV SOLN
INTRAVENOUS | Status: AC
  Filled 2022-03-28: qty 100

## 2022-03-28 MED ORDER — IOHEXOL 180 MG/ML  SOLN
INTRAMUSCULAR | Status: DC | PRN
  Administered 2022-03-28: 10 mL

## 2022-03-28 MED ORDER — LACTATED RINGERS IV SOLN
INTRAVENOUS | Status: DC | PRN
Start: 2022-03-28 — End: 2022-03-28

## 2022-03-28 MED ORDER — CHLORHEXIDINE GLUCONATE 0.12 % MT SOLN
OROMUCOSAL | Status: AC
  Administered 2022-03-28: 15 mL via OROMUCOSAL
  Filled 2022-03-28: qty 15

## 2022-03-28 MED ORDER — OXYBUTYNIN CHLORIDE 5 MG PO TABS: ORAL_TABLET | ORAL | 0 refills | Status: DC

## 2022-03-28 MED ORDER — FENTANYL CITRATE (PF) 100 MCG/2ML IJ SOLN: 25.0000 ug | INTRAMUSCULAR | Status: DC | PRN

## 2022-03-28 SURGICAL SUPPLY — 28 items
BAG DRAIN CYSTO-URO LG1000N (MISCELLANEOUS) ×2 IMPLANT
BASKET ZERO TIP 1.9FR (BASKET) ×1 IMPLANT
BRUSH SCRUB EZ 1% IODOPHOR (MISCELLANEOUS) ×2 IMPLANT
BSKT STON RTRVL ZERO TP 1.9FR (BASKET) ×1
CATH URET FLEX-TIP 2 LUMEN 10F (CATHETERS) IMPLANT
CATH URETL OPEN END 6X70 (CATHETERS) IMPLANT
CNTNR SPEC 2.5X3XGRAD LEK (MISCELLANEOUS)
CONT SPEC 4OZ STER OR WHT (MISCELLANEOUS)
CONT SPEC 4OZ STRL OR WHT (MISCELLANEOUS)
CONTAINER SPEC 2.5X3XGRAD LEK (MISCELLANEOUS) IMPLANT
DRAPE UTILITY 15X26 TOWEL STRL (DRAPES) ×2 IMPLANT
GLOVE SURG UNDER POLY LF SZ7.5 (GLOVE) ×2 IMPLANT
GOWN STRL REUS W/ TWL LRG LVL3 (GOWN DISPOSABLE) ×1 IMPLANT
GOWN STRL REUS W/ TWL XL LVL3 (GOWN DISPOSABLE) ×1 IMPLANT
GOWN STRL REUS W/TWL LRG LVL3 (GOWN DISPOSABLE) ×2
GOWN STRL REUS W/TWL XL LVL3 (GOWN DISPOSABLE) ×2
GUIDEWIRE STR DUAL SENSOR (WIRE) ×2 IMPLANT
IV NS IRRIG 3000ML ARTHROMATIC (IV SOLUTION) ×2 IMPLANT
KIT TURNOVER CYSTO (KITS) ×2 IMPLANT
PACK CYSTO AR (MISCELLANEOUS) ×2 IMPLANT
SET CYSTO W/LG BORE CLAMP LF (SET/KITS/TRAYS/PACK) ×2 IMPLANT
SHEATH NAVIGATOR HD 12/14X36 (SHEATH) IMPLANT
STENT URET 6FRX24 CONTOUR (STENTS) IMPLANT
STENT URET 6FRX26 CONTOUR (STENTS) IMPLANT
SURGILUBE 2OZ TUBE FLIPTOP (MISCELLANEOUS) ×2 IMPLANT
TRACTIP FLEXIVA PULSE ID 200 (Laser) ×2 IMPLANT
VALVE UROSEAL ADJ ENDO (VALVE) IMPLANT
WATER STERILE IRR 500ML POUR (IV SOLUTION) ×2 IMPLANT

## 2022-03-28 NOTE — Interval H&P Note (Signed)
History and Physical Interval Note: ? ?03/28/2022 ?2:21 PM ? ?Jesse Carlson  has presented today for surgery, with the diagnosis of Left Ureteral Stone.  The various methods of treatment have been discussed with the patient and family. After consideration of risks, benefits and other options for treatment, the patient has consented to  Procedure(s): ?CYSTOSCOPY/URETEROSCOPY/HOLMIUM LASER/STENT PLACEMENT (Left) as a surgical intervention.  The patient's history has been reviewed, patient examined, no change in status, stable for surgery.  I have reviewed the patient's chart and labs.  Questions were answered to the patient's satisfaction.   ? ? ?Shakevia Sarris C Lessie Manigo ? ? ?

## 2022-03-28 NOTE — Discharge Instructions (Addendum)
DISCHARGE INSTRUCTIONS FOR KIDNEY STONE/URETERAL STENT  ? ?MEDICATIONS:  ?1. Resume all your other meds from home.  ?2.  AZO (over-the-counter) can help with the burning/stinging when you urinate. ?3.  Oxybutynin is for bladder/stent irritation Rx was sent to your pharmacy. ? ?ACTIVITY:  ?1. May resume regular activities in 24 hours. ?2. No driving while on narcotic pain medications  ?3. Drink plenty of water  ?4. Continue to walk at home - you can still get blood clots when you are at home, so keep active, but don't over do it.  ?5. May return to work/school tomorrow or when you feel ready  ? ?BATHING:  ?1. You can shower. ?2. You have a string coming from your urethra: The stent string is attached to your ureteral stent. Do not pull on this.  ? ?SIGNS/SYMPTOMS TO CALL:  ?Common postoperative symptoms include urinary frequency, urgency, bladder spasm and blood in the urine ? ?Please call us if you have a fever greater than 101.5, uncontrolled nausea/vomiting, uncontrolled pain, dizziness, unable to urinate, excessively bloody urine, chest pain, shortness of breath, leg swelling, leg pain, or any other concerns or questions.  ? ?You can reach [place] at [phone].  ? ?FOLLOW-UP:  ?1. You we will be contacted for a follow-up appointment ?2. You have a string attached to your stent, you may remove it on Friday, 03/31/2022. To do this, pull the string until the stent is completely removed. You may feel an odd sensation in your back. ? ? ?AMBULATORY SURGERY  ?DISCHARGE INSTRUCTIONS ? ? ?The drugs that you were given will stay in your system until tomorrow so for the next 24 hours you should not: ? ?Drive an automobile ?Make any legal decisions ?Drink any alcoholic beverage ? ? ?You may resume regular meals tomorrow.  Today it is better to start with liquids and gradually work up to solid foods. ? ?You may eat anything you prefer, but it is better to start with liquids, then soup and crackers, and gradually work up to solid  foods. ? ? ?Please notify your doctor immediately if you have any unusual bleeding, trouble breathing, redness and pain at the surgery site, drainage, fever, or pain not relieved by medication. ? ? ? ?Additional Instructions: ? ? ? ? ? ? ? ?Please contact your physician with any problems or Same Day Surgery at [phone], Monday through Friday 6 am to 4 pm, or [place] at [facility] number at [phone].  ?

## 2022-03-28 NOTE — Anesthesia Procedure Notes (Signed)
Procedure Name: LMA Insertion ?Date/Time: 03/28/2022 2:26 PM ?Performed by: Philbert Riser, CRNA ?Pre-anesthesia Checklist: Patient identified, Patient being monitored, Timeout performed, Emergency Drugs available and Suction available ?Patient Re-evaluated:Patient Re-evaluated prior to induction ?Oxygen Delivery Method: Circle system utilized ?Preoxygenation: Pre-oxygenation with 100% oxygen ?Induction Type: IV induction ?Ventilation: Mask ventilation without difficulty ?LMA: LMA inserted ?LMA Size: 4.5 ?Tube type: Oral ?Number of attempts: 1 ?Placement Confirmation: positive ETCO2 and breath sounds checked- equal and bilateral ?Tube secured with: Tape ?Dental Injury: Teeth and Oropharynx as per pre-operative assessment  ? ? ? ? ?

## 2022-03-28 NOTE — Anesthesia Preprocedure Evaluation (Signed)
Anesthesia Evaluation  ?Patient identified by MRN, date of birth, ID band ?Patient awake ? ? ? ?Reviewed: ?Allergy & Precautions, H&P , NPO status , Patient's Chart, lab work & pertinent test results, reviewed documented beta blocker date and time  ? ?History of Anesthesia Complications ?Negative for: history of anesthetic complications ? ?Airway ?Mallampati: II ? ?TM Distance: >3 FB ?Neck ROM: full ? ? ? Dental ? ?(+) Dental Advidsory Given, Teeth Intact ?  ?Pulmonary ?neg pulmonary ROS,  ?  ?Pulmonary exam normal ?breath sounds clear to auscultation ? ? ? ? ? ? Cardiovascular ?Exercise Tolerance: Good ?negative cardio ROS ?Normal cardiovascular exam ?Rhythm:regular Rate:Normal ? ? ?  ?Neuro/Psych ?negative neurological ROS ? negative psych ROS  ? GI/Hepatic ?negative GI ROS, Neg liver ROS,   ?Endo/Other  ?negative endocrine ROS ? Renal/GU ?Renal disease (Kidney stones)  ?negative genitourinary ?  ?Musculoskeletal ? ? Abdominal ?  ?Peds ? Hematology ?negative hematology ROS ?(+)   ?Anesthesia Other Findings ?Past Medical History: ?No date: Kidney calculus ? ? Reproductive/Obstetrics ?negative OB ROS ? ?  ? ? ? ? ? ? ? ? ? ? ? ? ? ?  ?  ? ? ? ? ? ? ? ? ?Anesthesia Physical ?Anesthesia Plan ? ?ASA: 2 ? ?Anesthesia Plan: General  ? ?Post-op Pain Management:   ? ?Induction: Intravenous ? ?PONV Risk Score and Plan: 2 and Ondansetron, Dexamethasone, Midazolam and Treatment may vary due to age or medical condition ? ?Airway Management Planned: LMA ? ?Additional Equipment:  ? ?Intra-op Plan:  ? ?Post-operative Plan: Extubation in OR ? ?Informed Consent: I have reviewed the patients History and Physical, chart, labs and discussed the procedure including the risks, benefits and alternatives for the proposed anesthesia with the patient or authorized representative who has indicated his/her understanding and acceptance.  ? ? ? ?Dental Advisory Given ? ?Plan Discussed with: Anesthesiologist,  CRNA and Surgeon ? ?Anesthesia Plan Comments:   ? ? ? ? ? ? ?Anesthesia Quick Evaluation ? ?

## 2022-03-28 NOTE — Op Note (Signed)
Preoperative diagnosis: Left distal ureteral calculus ? ?Postoperative diagnosis: Left distal ureteral calculus ? ?Procedure: ? ?Cystoscopy ?Left ureteroscopy and stone removal ?Ureteroscopic laser lithotripsy ?Left ureteral stent placement (23F/26 cm)  ?Left retrograde pyelography with interpretation ?Intraoperative fluoroscopy ? ?Surgeon: Ronda Fairly. Dionisia Pacholski, M.D. ? ?Anesthesia: General ? ?Complications: None ? ?Intraoperative findings:  ?Urethra normal in caliber without stricture.  Prostate nonocclusive and normal bladder neck.  Bladder mucosa without erythema, solid or papillary lesions.  He was normal-appearing with clear efflux ?Ureteroscopy-calculus midportion left distal ureter with mild mucosal inflammatory changes. ?Left retrograde pyelography post procedure showed no filling defects, stone fragments or contrast extravasation ? ?EBL: Minimal ? ?Specimens: ?Calculus fragments for analysis ? ? ?Indication: NIL AMIRIAN is a 26 y.o. male with a 4 mm left distal ureteral calculus.  Has had multiple ED visits since late April for recurrent flank pain.  Follow-up CT 5/12 showed persistent calculus.  After reviewing the management options for treatment, the patient elected to proceed with the above surgical procedure(s). We have discussed the potential benefits and risks of the procedure, side effects of the proposed treatment, the likelihood of the patient achieving the goals of the procedure, and any potential problems that might occur during the procedure or recuperation. Informed consent has been obtained. ? ?Description of procedure: ? ?The patient was taken to the operating room and general anesthesia was induced.  The patient was placed in the dorsal lithotomy position, prepped and draped in the usual sterile fashion, and preoperative antibiotics were administered. A preoperative time-out was performed.  ? ?A 21 French cystoscope was lubricated, passed per urethra and advanced proximally under direct  vision with findings as described above.  ? ?Attention was directed to the left ureteral orifice and a 0.038 Sensor wire was then advanced up the ureter into the renal pelvis under fluoroscopic guidance. ? ?A 4.5 Fr semirigid ureteroscope was then advanced into the ureter next to the guidewire and the calculus was identified as described above. ? ?The stone was then fragmented with a 242 ?m holmium laser fiber on a setting of 0.3 J/40 Hz. ? ?All fragments were then removed from the ureter with a zero tip nitinol basket.  Reinspection of the ureter revealed no remaining visible stones or fragments.  The ureteroscope was advanced to the upper proximal ureter. ? ?Retrograde pyelogram was performed with findings as described above. ? ?A 23F/26 cm Contour ureteral stent with tether was placed under fluoroscopic guidance.  The wire was then removed with an adequate stent curl noted in the renal pelvis as well as in the bladder.  The stent tether was secured to the dorsal penis with benzoin and a Tegaderm ? ?The patient appeared to tolerate the procedure well and without complications.  After anesthetic reversal the patient was transported to the PACU in stable condition.  ? ?Plan: ?He was instructed to remove the stent on Friday, 03/31/2022 ?Postop follow-up approximately 1 month ? ? ?John Giovanni, MD ? ?

## 2022-03-28 NOTE — Anesthesia Postprocedure Evaluation (Signed)
Anesthesia Post Note ? ?Patient: Jesse Carlson ? ?Procedure(s) Performed: CYSTOSCOPY/URETEROSCOPY/HOLMIUM LASER/STENT PLACEMENT (Left) ?CYSTOSCOPY WITH RETROGRADE PYELOGRAM (Left) ? ?Patient location during evaluation: PACU ?Anesthesia Type: General ?Level of consciousness: awake and alert ?Pain management: pain level controlled ?Vital Signs Assessment: post-procedure vital signs reviewed and stable ?Respiratory status: spontaneous breathing, nonlabored ventilation, respiratory function stable and patient connected to nasal cannula oxygen ?Cardiovascular status: blood pressure returned to baseline and stable ?Postop Assessment: no apparent nausea or vomiting ?Anesthetic complications: no ? ? ?No notable events documented. ? ? ?Last Vitals:  ?Vitals:  ? 03/28/22 1532 03/28/22 1548  ?BP: 136/83 132/75  ?Pulse: 95 83  ?Resp: 12 16  ?Temp: 36.6 ?C 36.7 ?C  ?SpO2: 99% 99%  ?  ?Last Pain:  ?Vitals:  ? 03/28/22 1548  ?TempSrc: Temporal  ?PainSc: 0-No pain  ? ? ?  ?  ?  ?  ?  ?  ? ?Lenard Simmer ? ? ? ? ?

## 2022-03-28 NOTE — Transfer of Care (Addendum)
Immediate Anesthesia Transfer of Care Note ? ?Patient: Jesse Carlson ? ?Procedure(s) Performed: CYSTOSCOPY/URETEROSCOPY/HOLMIUM LASER/STENT PLACEMENT (Left) ?CYSTOSCOPY WITH RETROGRADE PYELOGRAM (Left) ? ?Patient Location: PACU ? ?Anesthesia Type:General ? ?Level of Consciousness: drowsy ? ?Airway & Oxygen Therapy: Patient Spontanous Breathing and Patient connected to face mask oxygen ? ?Post-op Assessment: Report given to RN and Post -op Vital signs reviewed and stable ? ?Post vital signs: Reviewed and stable ? ?Last Vitals:  ?Vitals Value Taken Time  ?BP    ?Temp    ?Pulse    ?Resp    ?SpO2    ? ? ?Last Pain:  ?Vitals:  ? 03/28/22 1400  ?TempSrc: Temporal  ?PainSc: 6   ?   ? ?  ? ?Complications: No notable events documented. ?

## 2022-03-29 ENCOUNTER — Encounter: Payer: Self-pay | Admitting: Urology

## 2022-04-03 LAB — CALCULI, WITH PHOTOGRAPH (CLINICAL LAB)
Calcium Oxalate Monohydrate: 100 %
Weight Calculi: 8 mg

## 2022-04-26 IMAGING — CT CT RENAL STONE PROTOCOL
1 of 3 series · 14 of 32 positions shown, 19 images · non-contrast
Comparison: 03/09/2021

CLINICAL DATA: Left flank pain



[Series 3: thins · axial · 0.77mm/px · z∈[-834,-403]mm · 14 of 685 slices shown, 19 images]
[im 35/685  soft-tissue]
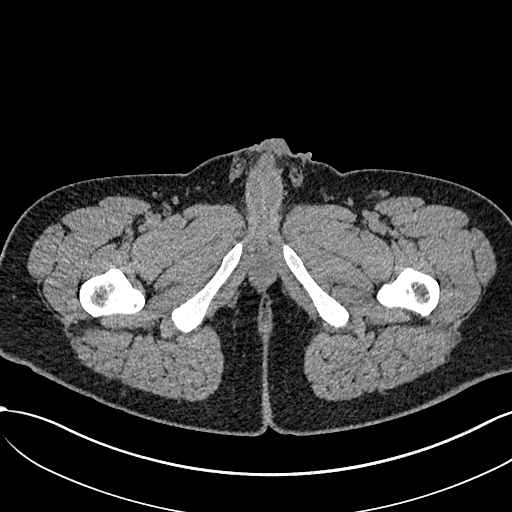
[im 35/685  bone]
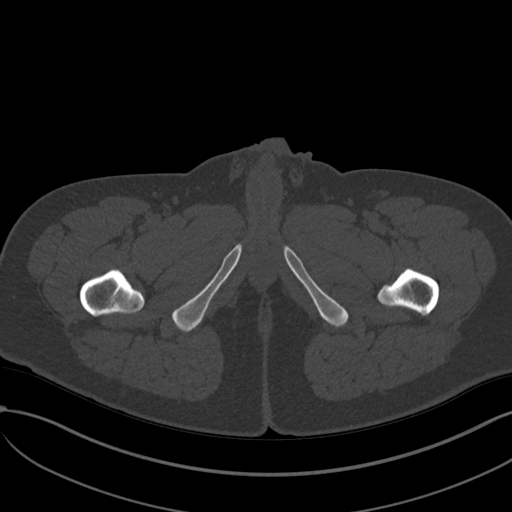
[im 103/685  soft-tissue]
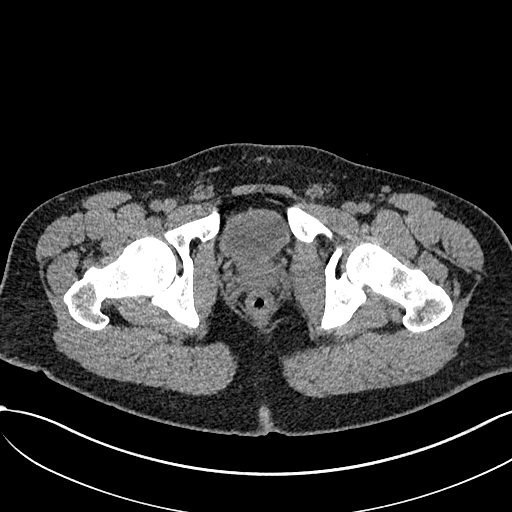
[im 137/685  soft-tissue]
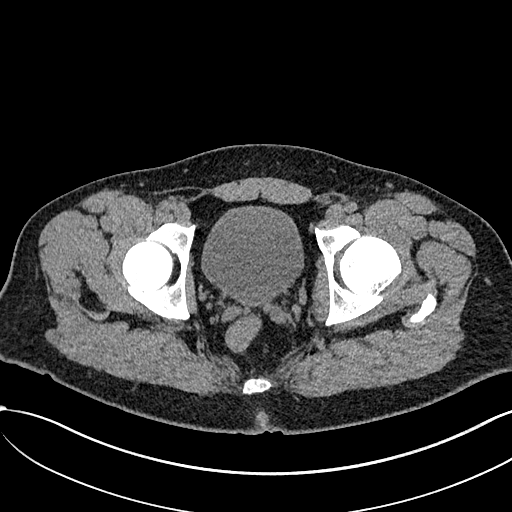
[im 206/685  soft-tissue]
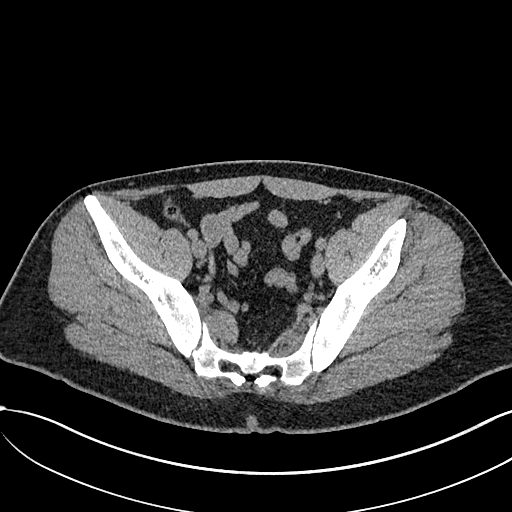
[im 240/685  soft-tissue]
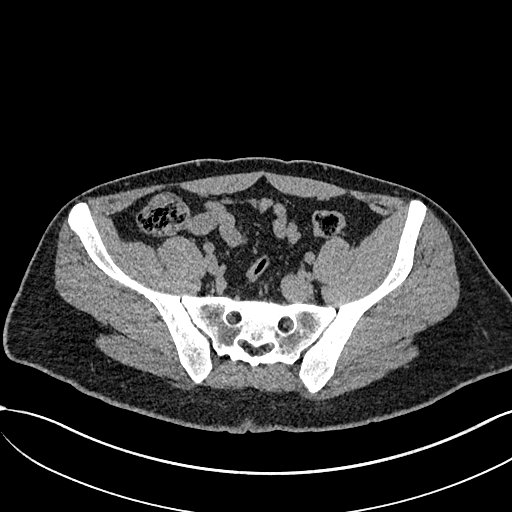
[im 308/685  soft-tissue]
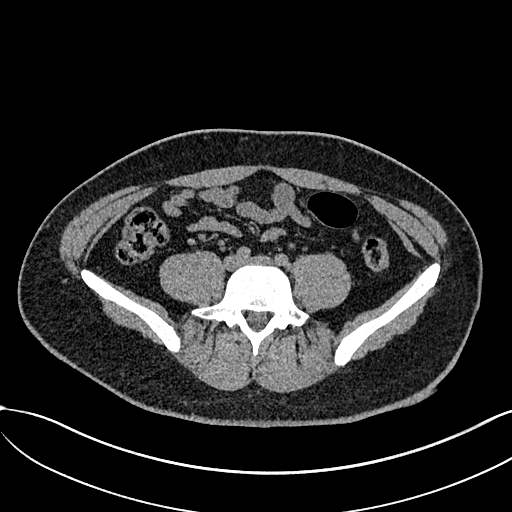
[im 343/685  soft-tissue]
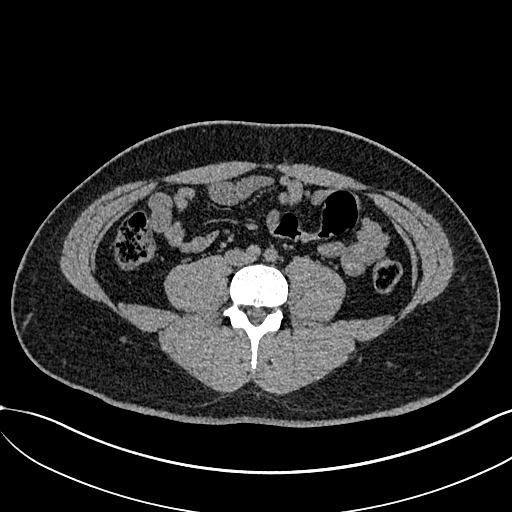
[im 377/685  soft-tissue]
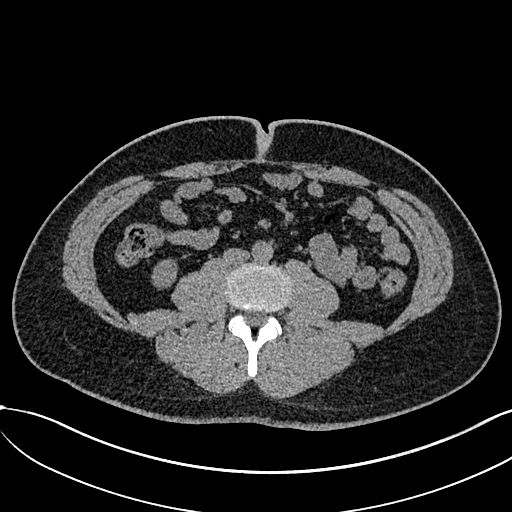
[im 445/685  soft-tissue]
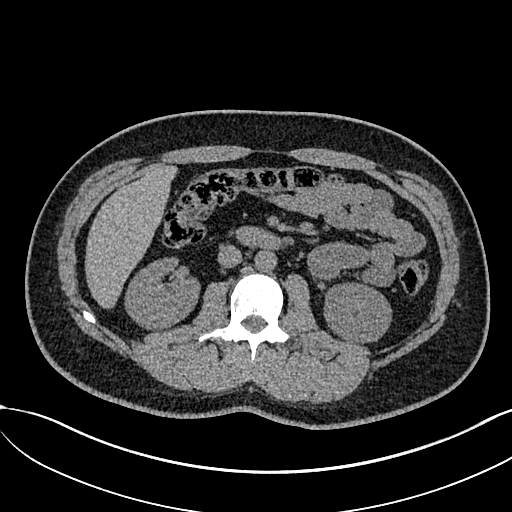
[im 445/685  bone]
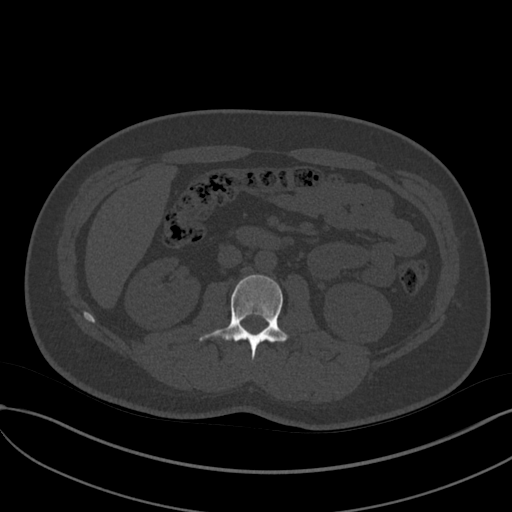
[im 479/685  soft-tissue]
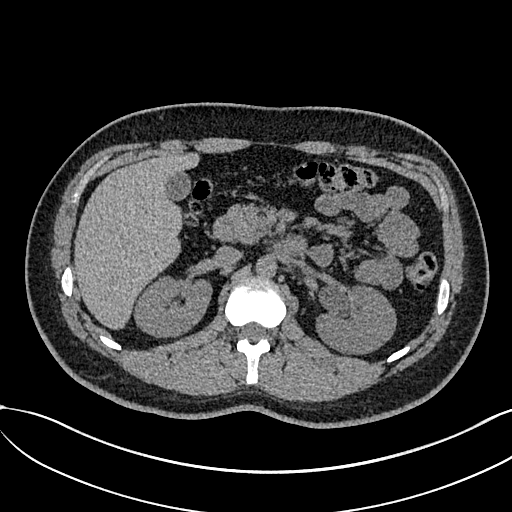
[im 548/685  soft-tissue]
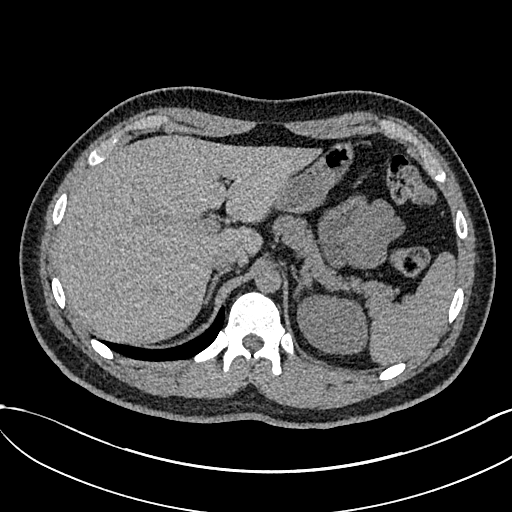
[im 548/685  lung]
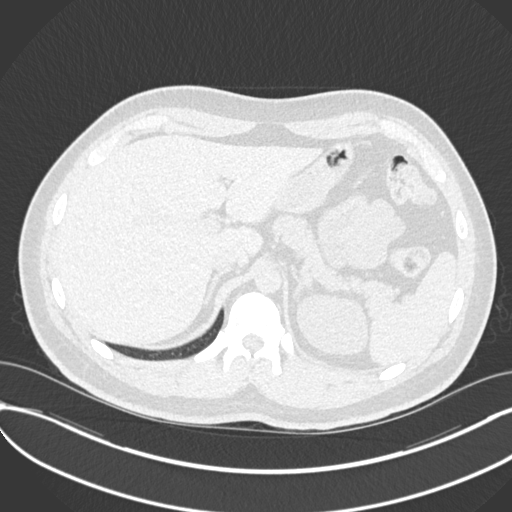
[im 582/685  soft-tissue]
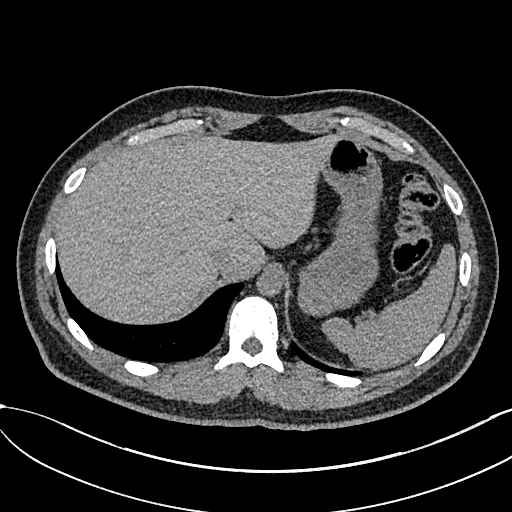
[im 582/685  lung]
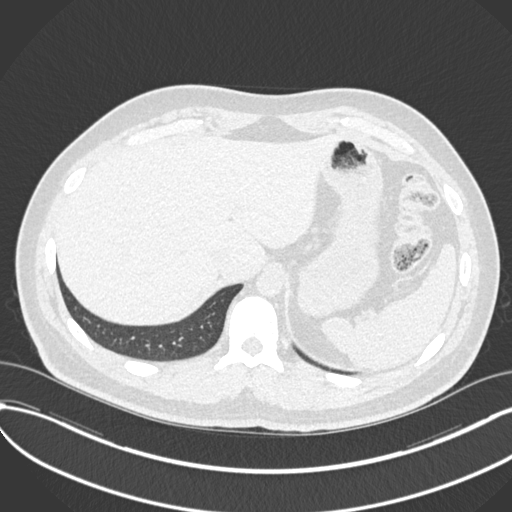
[im 616/685  lung]
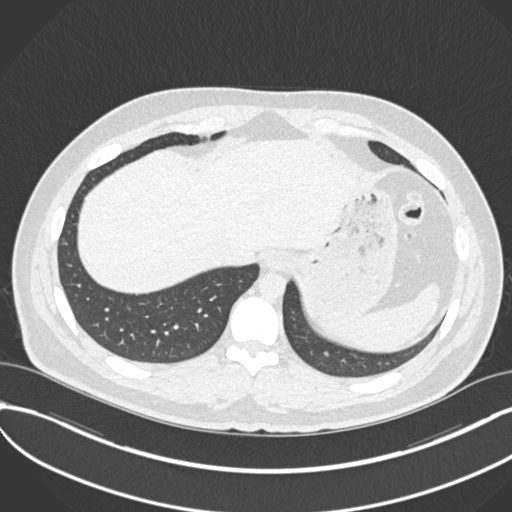
[im 650/685  soft-tissue]
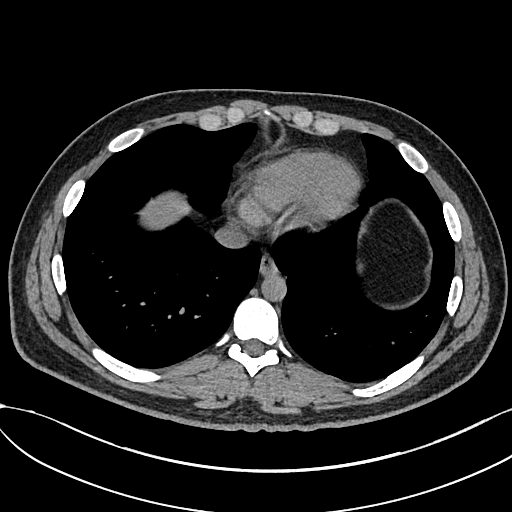
[im 650/685  lung]
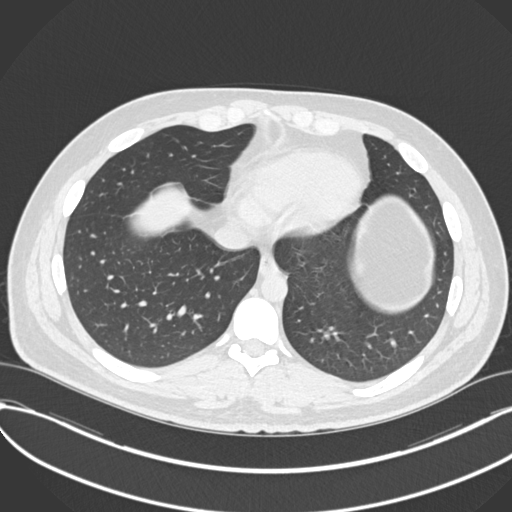

[14 of 32 positions shown; findings below may reference images not displayed]

FINDINGS: Lower chest: No acute abnormality.

Hepatobiliary: No solid liver abnormality is seen. No gallstones,
gallbladder wall thickening, or biliary dilatation.

Pancreas: Unremarkable. No pancreatic ductal dilatation or
surrounding inflammatory changes.

Spleen: Normal in size without significant abnormality.

Adrenals/Urinary Tract: Adrenal glands are unremarkable. There is a
0.4 cm calculus in the proximal third of the left ureter with mild
associated hydronephrosis and hydroureter. Multiple additional small
bilateral renal calculi. No right-sided hydronephrosis. Bladder is
unremarkable.

Stomach/Bowel: Stomach is within normal limits. Appendix appears
normal. No evidence of bowel wall thickening, distention, or
inflammatory changes.

Vascular/Lymphatic: No significant vascular findings are present. No
enlarged abdominal or pelvic lymph nodes.

Reproductive: No mass or other significant abnormality.

Other: No abdominal wall hernia or abnormality. No ascites.

Musculoskeletal: No acute or significant osseous findings.
IMPRESSION: 1. There is a 0.4 cm calculus in the proximal third of the left
ureter with mild associated hydronephrosis and hydroureter.

2.  Multiple additional small bilateral renal calculi.

## 2022-04-28 ENCOUNTER — Encounter: Payer: Self-pay | Admitting: Urology

## 2022-04-28 ENCOUNTER — Ambulatory Visit: Payer: 59 | Admitting: Urology

## 2022-05-03 ENCOUNTER — Emergency Department
Admission: EM | Admit: 2022-05-03 | Discharge: 2022-05-03 | Disposition: A | Discharge status: Home / Self Care | Payer: 59 | Attending: Emergency Medicine | Admitting: Emergency Medicine

## 2022-05-03 ENCOUNTER — Other Ambulatory Visit: Payer: Self-pay

## 2022-05-03 ENCOUNTER — Encounter: Payer: Self-pay | Admitting: Emergency Medicine

## 2022-05-03 ENCOUNTER — Emergency Department: Payer: 59

## 2022-05-03 DIAGNOSIS — R109 Unspecified abdominal pain: Secondary | ICD-10-CM | POA: Diagnosis present

## 2022-05-03 DIAGNOSIS — N132 Hydronephrosis with renal and ureteral calculous obstruction: Secondary | ICD-10-CM | POA: Insufficient documentation

## 2022-05-03 DIAGNOSIS — N200 Calculus of kidney: Secondary | ICD-10-CM

## 2022-05-03 DIAGNOSIS — Z87442 Personal history of urinary calculi: Secondary | ICD-10-CM | POA: Insufficient documentation

## 2022-05-03 LAB — URINALYSIS, ROUTINE W REFLEX MICROSCOPIC
Bacteria, UA: NONE SEEN
Bilirubin Urine: NEGATIVE
Glucose, UA: NEGATIVE mg/dL
Ketones, ur: NEGATIVE mg/dL
Leukocytes,Ua: NEGATIVE
Nitrite: NEGATIVE
Protein, ur: NEGATIVE mg/dL
Specific Gravity, Urine: 1.005 (ref 1.005–1.030)
Squamous Epithelial / HPF: NONE SEEN (ref 0–5)
pH: 6 (ref 5.0–8.0)

## 2022-05-03 LAB — CBC
HCT: 44 % (ref 39.0–52.0)
Hemoglobin: 14.9 g/dL (ref 13.0–17.0)
MCH: 29.4 pg (ref 26.0–34.0)
MCHC: 33.9 g/dL (ref 30.0–36.0)
MCV: 86.8 fL (ref 80.0–100.0)
Platelets: 343 10*3/uL (ref 150–400)
RBC: 5.07 MIL/uL (ref 4.22–5.81)
RDW: 12.1 % (ref 11.5–15.5)
WBC: 6.1 10*3/uL (ref 4.0–10.5)
nRBC: 0 % (ref 0.0–0.2)

## 2022-05-03 LAB — BASIC METABOLIC PANEL
Anion gap: 7 (ref 5–15)
BUN: 19 mg/dL (ref 6–20)
CO2: 28 mmol/L (ref 22–32)
Calcium: 9.7 mg/dL (ref 8.9–10.3)
Chloride: 105 mmol/L (ref 98–111)
Creatinine, Ser: 1.11 mg/dL (ref 0.61–1.24)
GFR, Estimated: >60 mL/min (ref >60)
Glucose, Bld: 119 mg/dL — ABNORMAL HIGH (ref 70–99)
Potassium: 4.1 mmol/L (ref 3.5–5.1)
Sodium: 140 mmol/L (ref 135–145)

## 2022-05-03 MED ORDER — OXYCODONE-ACETAMINOPHEN 5-325 MG PO TABS: 1.0000 | ORAL_TABLET | ORAL | 0 refills | Status: DC | PRN

## 2022-05-03 MED ORDER — ONDANSETRON 4 MG PO TBDP
4.0000 mg | ORAL_TABLET | Freq: Once | ORAL | Status: AC
  Administered 2022-05-03: 4 mg via ORAL
  Filled 2022-05-03: qty 1

## 2022-05-03 MED ORDER — OXYCODONE-ACETAMINOPHEN 5-325 MG PO TABS
1.0000 | ORAL_TABLET | ORAL | Status: DC | PRN
  Administered 2022-05-03: 1 via ORAL
  Filled 2022-05-03: qty 1

## 2022-05-03 MED ORDER — ONDANSETRON 4 MG PO TBDP: 4.0000 mg | ORAL_TABLET | Freq: Three times a day (TID) | ORAL | 0 refills | Status: DC | PRN

## 2022-05-03 MED ORDER — KETOROLAC TROMETHAMINE 30 MG/ML IJ SOLN
15.0000 mg | Freq: Once | INTRAMUSCULAR | Status: AC
Start: 2022-05-03 — End: 2022-05-03
  Administered 2022-05-03: 15 mg via INTRAVENOUS
  Filled 2022-05-03: qty 1

## 2022-05-03 MED ORDER — SODIUM CHLORIDE 0.9 % IV BOLUS
1000.0000 mL | Freq: Once | INTRAVENOUS | Status: AC
Start: 2022-05-03 — End: 2022-05-03
  Administered 2022-05-03: 1000 mL via INTRAVENOUS

## 2022-05-03 MED ORDER — ONDANSETRON HCL 4 MG/2ML IJ SOLN
4.0000 mg | Freq: Once | INTRAMUSCULAR | Status: AC
  Administered 2022-05-03: 4 mg via INTRAVENOUS
  Filled 2022-05-03: qty 2

## 2022-05-03 NOTE — ED Notes (Addendum)
This RN first encounter with pt prior to discharge. Pt verbalized understanding of discharge instructions, prescriptions, and follow-up care instructions. Pt advised if symptoms worsen to return to ED. E-signature not available due to e-signature pad not working.   ?

## 2022-05-03 NOTE — ED Triage Notes (Signed)
Pt via POV from home. Pt c/o R flank pain and nauseous that started a couple of days ago. Pt has hx of kidney stones. Pt is A&OX4 and NAD

## 2022-05-03 NOTE — ED Provider Notes (Signed)
[facility] Provider Note    Event Date/Time   First MD Initiated Contact with Patient 05/03/22 1107     (approximate)   History   Chief Complaint Flank Pain   HPI  Jesse Carlson is a 26 y.o. male with past medical history of kidney stones who presents to the ED complaining of flank pain.  Patient reports that he has been dealing with 24 to 48 hours of intermittent pain wrapping around his right flank.  He states the pain has now gotten sharp, severe, and constant since this morning.  He has been feeling nauseous but has not vomited, denies any associated diarrhea or constipation.  He has noticed some blood in his urine but denies any dysuria or fevers.  He describes symptoms as similar to prior kidney stones and he required ureteroscopy with stent placement for a kidney stone last month on the left side, had been feeling better until recently.  He received Percocet and Zofran in triage, denies any relief in his symptoms.     Physical Exam   Triage Vital Signs: ED Triage Vitals  Enc Vitals Group     BP 05/03/22 1004 (!) 135/99     Pulse Rate 05/03/22 1004 70     Resp 05/03/22 1004 20     Temp 05/03/22 1004 (!) 97.5 F (36.4 C)     Temp Source 05/03/22 1004 Oral     SpO2 05/03/22 1004 100 %     Weight 05/03/22 1002 200 lb (90.7 kg)     Height 05/03/22 1002 5\' 10"  (1.778 m)     Head Circumference --      Peak Flow --      Pain Score 05/03/22 1002 10     Pain Loc --      Pain Edu? --      Excl. in GC? --     Most recent vital signs: Vitals:   05/03/22 1004 05/03/22 1134  BP: (!) 135/99 136/80  Pulse: 70 95  Resp: 20 18  Temp: (!) 97.5 F (36.4 C)   SpO2: 100% 100%    Constitutional: Alert and oriented. Eyes: Conjunctivae are normal. Head: Atraumatic. Nose: No congestion/rhinnorhea. Mouth/Throat: Mucous membranes are moist.  Cardiovascular: Normal rate, regular rhythm. Grossly normal heart sounds.  2+ radial pulses  bilaterally. Respiratory: Normal respiratory effort.  No retractions. Lungs CTAB. Gastrointestinal: Soft and nontender.  Right CVA tenderness noted.  No distention. Musculoskeletal: No lower extremity tenderness nor edema.  Neurologic:  Normal speech and language. No gross focal neurologic deficits are appreciated.    ED Results / Procedures / Treatments   Labs (all labs ordered are listed, but only abnormal results are displayed) Labs Reviewed  URINALYSIS, ROUTINE W REFLEX MICROSCOPIC - Abnormal; Notable for the following components:      Result Value   Color, Urine COLORLESS (*)    APPearance CLEAR (*)    Hgb urine dipstick MODERATE (*)    All other components within normal limits  BASIC METABOLIC PANEL - Abnormal; Notable for the following components:   Glucose, Bld 119 (*)    All other components within normal limits  CBC   RADIOLOGY CT renal protocol reviewed and interpreted by me with right-sided ureteral stone near the UVJ with associated hydronephrosis.  PROCEDURES:  Critical Care performed: No  Procedures   MEDICATIONS ORDERED IN ED: Medications  oxyCODONE-acetaminophen (PERCOCET/ROXICET) 5-325 MG per tablet 1 tablet (1 tablet Oral Given 05/03/22 1007)  ondansetron (ZOFRAN-ODT) disintegrating  tablet 4 mg (4 mg Oral Given 05/03/22 1007)  ketorolac (TORADOL) 30 MG/ML injection 15 mg (15 mg Intravenous Given 05/03/22 1147)  sodium chloride 0.9 % bolus 1,000 mL (1,000 mLs Intravenous New Bag/Given 05/03/22 1147)  ondansetron (ZOFRAN) injection 4 mg (4 mg Intravenous Given 05/03/22 1146)     IMPRESSION / MDM / ASSESSMENT AND PLAN / ED COURSE  I reviewed the triage vital signs and the nursing notes.                              26 y.o. male with past medical history of kidney stones presents to the ED with waxing and waning pain in his right flank for the past 24 to 48 hours associated with nausea.  Patient's presentation is most consistent with acute presentation  with potential threat to life or bodily function.  Differential diagnosis includes, but is not limited to, kidney stone, pyelonephritis, appendicitis, dehydration, electrolyte abnormality, AKI.  Patient nontoxic-appearing and in no acute distress, vital signs are unremarkable.  Labs are reassuring with no significant anemia, leukocytosis, electrolyte abnormality, or AKI.  Urinalysis without signs of infection.  CT scan does show 3 mm stone at the UVJ on the right, explaining patient's symptoms.  We will treat symptomatically with IV Toradol and additional dose of Zofran, hydrate with IV fluids and reassess.  Patient's pain improved on reassessment and he is appropriate for discharge home with outpatient urology follow-up.  He was counseled to return to the ED for new worsening symptoms, patient agrees with plan.      FINAL CLINICAL IMPRESSION(S) / ED DIAGNOSES   Final diagnoses:  Right flank pain  Kidney stone     Rx / DC Orders   ED Discharge Orders          Ordered    oxyCODONE-acetaminophen (PERCOCET) 5-325 MG tablet  Every 4 hours PRN        05/03/22 1221    ondansetron (ZOFRAN-ODT) 4 MG disintegrating tablet  Every 8 hours PRN        05/03/22 1221             Note:  This document was prepared using Dragon voice recognition software and may include unintentional dictation errors.   Chesley Noon, MD 05/03/22 [phone]

## 2022-05-10 ENCOUNTER — Ambulatory Visit (INDEPENDENT_AMBULATORY_CARE_PROVIDER_SITE_OTHER): Payer: 59 | Admitting: Urology

## 2022-05-10 ENCOUNTER — Encounter: Payer: Self-pay | Admitting: Urology

## 2022-05-10 VITALS — BP 146/74 | HR 108 | Ht 70.0 in | Wt 200.0 lb

## 2022-05-10 DIAGNOSIS — N23 Unspecified renal colic: Secondary | ICD-10-CM | POA: Diagnosis not present

## 2022-05-10 DIAGNOSIS — N200 Calculus of kidney: Secondary | ICD-10-CM

## 2022-05-10 DIAGNOSIS — N201 Calculus of ureter: Secondary | ICD-10-CM | POA: Diagnosis not present

## 2022-05-10 MED ORDER — TAMSULOSIN HCL 0.4 MG PO CAPS: 0.4000 mg | ORAL_CAPSULE | Freq: Every day | ORAL | 0 refills | Status: DC

## 2022-05-10 MED ORDER — OXYCODONE-ACETAMINOPHEN 5-325 MG PO TABS: 1.0000 | ORAL_TABLET | Freq: Four times a day (QID) | ORAL | 0 refills | Status: AC | PRN

## 2022-05-10 NOTE — Progress Notes (Signed)
05/10/2022 4:25 PM   Jesse Carlson 01/13/1996 [id number]  Referring provider: No referring provider defined for this encounter.  Chief Complaint  Patient presents with   Nephrolithiasis    HPI: 26 y.o. male presents for postop follow-up.  s/p ureteroscopic removal of a 4 mm left distal ureteral calculus 03/28/2022 Stent was left on a tether which she removed 3 days postop and had no problems ED visit 05/03/2022 with complaints of right renal colic and CT showed a 3 mm right UVJ stone He was given prescriptions for oxycodone and Zofran however states he was told by the pharmacy that oxycodone was not approved.  On review of narcotic Rx website this prescription was not filled Stone analysis was 100% calcium oxalate monohydrate Recommend metabolic stone evaluation ~ 1 month after current episode resolves   PMH: Past Medical History:  Diagnosis Date   Kidney calculus     Surgical History: Past Surgical History:  Procedure Laterality Date   CYSTOSCOPY W/ RETROGRADES Left 03/28/2022   Procedure: CYSTOSCOPY WITH RETROGRADE PYELOGRAM;  Surgeon: Riki Altes, MD;  Location: [facility] ORS;  Service: Urology;  Laterality: Left;   CYSTOSCOPY/URETEROSCOPY/HOLMIUM LASER/STENT PLACEMENT Left 03/28/2022   Procedure: CYSTOSCOPY/URETEROSCOPY/HOLMIUM LASER/STENT PLACEMENT;  Surgeon: Riki Altes, MD;  Location: [facility];  Service: Urology;  Laterality: Left;    Home Medications:  Allergies as of 05/10/2022   No Known Allergies      Medication List        Accurate as of May 10, 2022  4:25 PM. If you have any questions, ask your nurse or doctor.          ondansetron 4 MG disintegrating tablet Commonly known as: ZOFRAN-ODT Take 1 tablet (4 mg total) by mouth every 8 (eight) hours as needed for nausea or vomiting.   oxybutynin 5 MG tablet Commonly known as: DITROPAN 1 tab tid prn frequency,urgency, bladder spasm   oxyCODONE-acetaminophen 5-325 MG tablet Commonly  known as: Percocet Take 1 tablet by mouth every 4 (four) hours as needed for severe pain.        Allergies: No Known Allergies  Family History: No family history on file.  Social History:  reports that he has never smoked. He uses smokeless tobacco. He reports that he does not drink alcohol and does not use drugs.   Physical Exam: BP (!) 146/74   Pulse (!) 108   Ht 5\' 10"  (1.778 m)   Wt 200 lb (90.7 kg)   BMI 28.70 kg/m   Constitutional:  Alert, No acute distress. HEENT: [place] AT Respiratory: Normal respiratory effort, no increased work of breathing. Psychiatric: Normal mood and affect.   Pertinent Imaging: CT images were personally reviewed and interpreted  CT Renal Stone Study  Narrative CLINICAL DATA:  Right flank pain and nausea, renal stone suspected.  EXAM: CT ABDOMEN AND PELVIS WITHOUT CONTRAST  TECHNIQUE: Multidetector CT imaging of the abdomen and pelvis was performed following the standard protocol without IV contrast.  RADIATION DOSE REDUCTION: This exam was performed according to the departmental dose-optimization program which includes automated exposure control, adjustment of the mA and/or kV according to patient size and/or use of iterative reconstruction technique.  COMPARISON:  Mar 24, 2022 CT renal stone  FINDINGS: Lower chest: No acute abnormality.  Hepatobiliary: Unremarkable noncontrast appearance of the liver and gallbladder. No biliary ductal dilation.  Pancreas: No pancreatic ductal dilation or evidence of acute inflammation.  Spleen: No splenomegaly or focal splenic lesion.  Adrenals/Urinary Tract: Bilateral adrenal glands appear  normal.  Right-sided hydroureteronephrosis to a 3 mm stone in the distal ureter just proximal to the ureterovesicular junction. Decreased now minimal prominence of the left collecting system with interval passage of the distal left ureteral stone. Additional punctate nonobstructive bilateral renal  stones measure up to 3 mm in the right kidney. Urinary bladder is unremarkable for degree of distension.  Stomach/Bowel: Stomach is within normal limits. Appendix appears normal. No evidence of bowel wall thickening, distention, or inflammatory changes.  Vascular/Lymphatic: No significant vascular findings are present. No enlarged abdominal or pelvic lymph nodes.  Reproductive: Prostate is unremarkable.  Other: No abdominal wall hernia or abnormality. No abdominopelvic ascites.  Musculoskeletal: No acute or significant osseous findings.  IMPRESSION: 1. Mild right-sided hydroureteronephrosis to a 3 mm stone in the distal ureter near the UVJ. 2. Additional punctate nonobstructive bilateral renal stones measure up to 3 mm. 3. Decreased now minimal prominence of the left renal collecting system with interval passage of the distal left ureteral stone.   Electronically Signed By: Maudry Mayhew M.D. On: 05/03/2022 11:18   Assessment & Plan:   Doing well status post ureteroscopic removal of a left ureteral calculus; stone analysis 100% [place] Recent ED visit for migration of a right renal calculus to the distal ureter Still symptomatic Rxs tamsulosin and oxycodone were sent to pharmacy KUB ordered 2 week follow-up with repeat KUB If he has worsening pain and desires ureteroscopy prior to follow-up he was instructed to call back   Riki Altes, MD  [facility] [address], [address] [place], [place] [address] [phone]

## 2022-05-12 IMAGING — CT CT RENAL STONE PROTOCOL
2 of 4 series · 16 of 46 positions shown, 18 images · non-contrast
Comparison: 03/08/2022

CLINICAL DATA: Left-sided flank pain initially now with right-sided
flank pain. Urology appointment on [REDACTED].



[Series 2: stone full standard · axial · 0.85mm/px · z∈[-514,-44]mm · 13 of 102 slices shown, 15 images]
[im 4/102  soft-tissue]
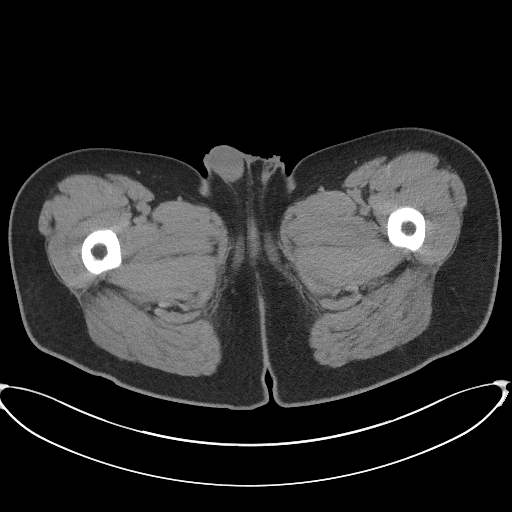
[im 4/102  bone]
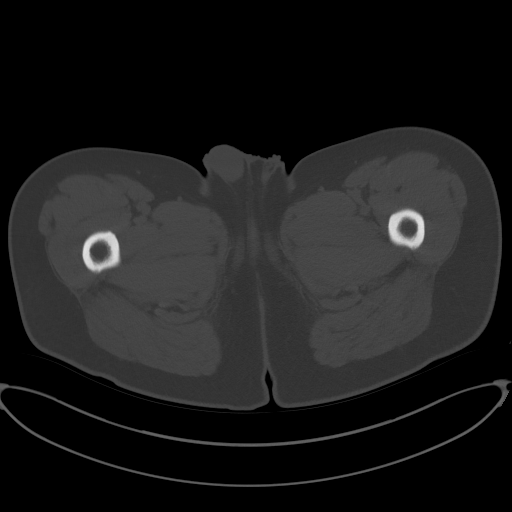
[im 12/102  soft-tissue]
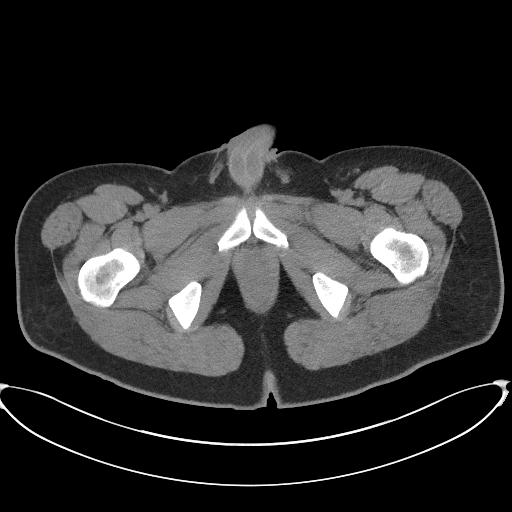
[im 20/102  soft-tissue]
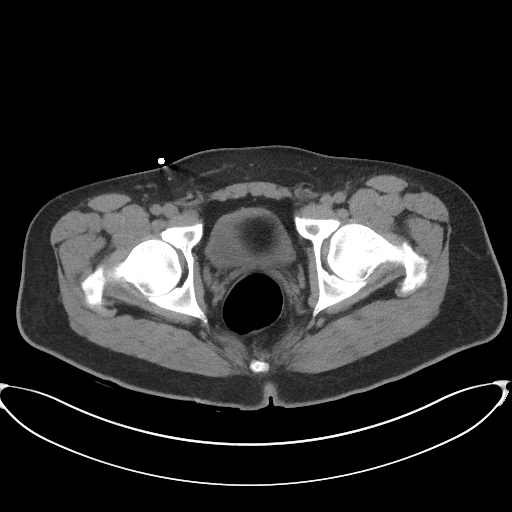
[im 28/102  soft-tissue]
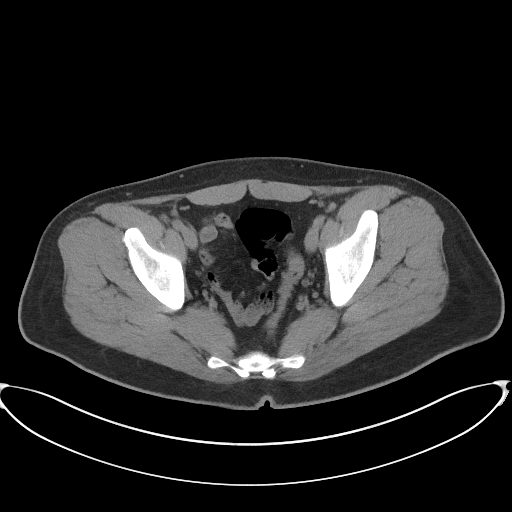
[im 35/102  soft-tissue]
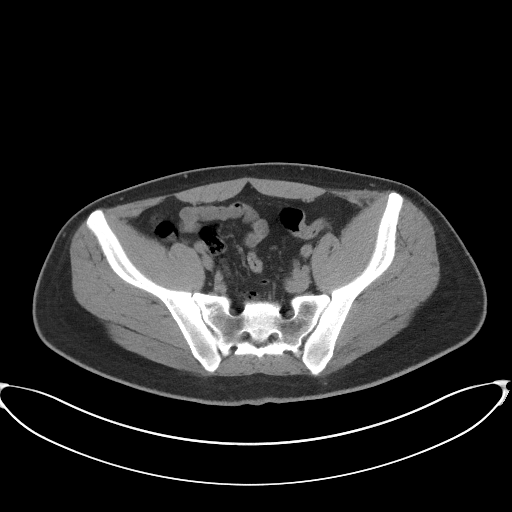
[im 43/102  soft-tissue]
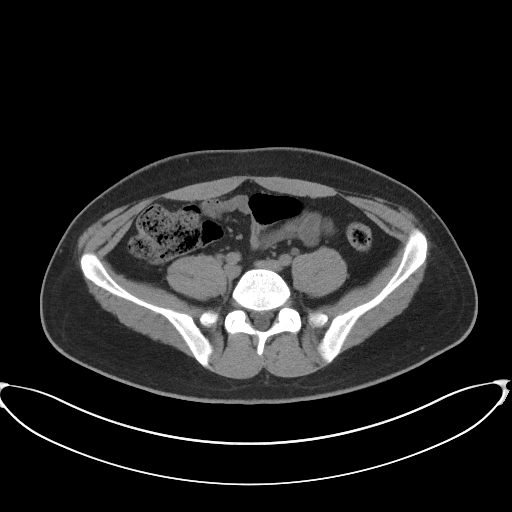
[im 51/102  soft-tissue]
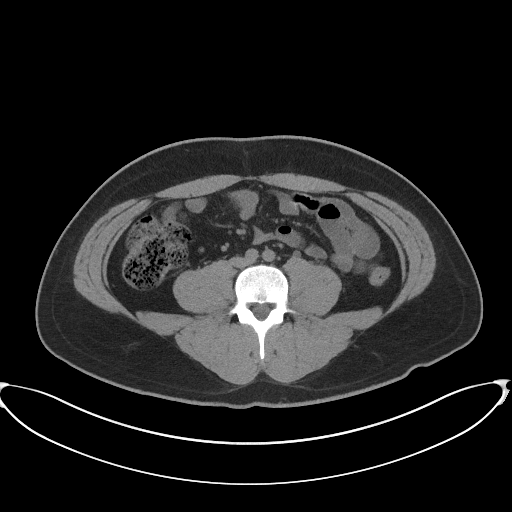
[im 59/102  soft-tissue]
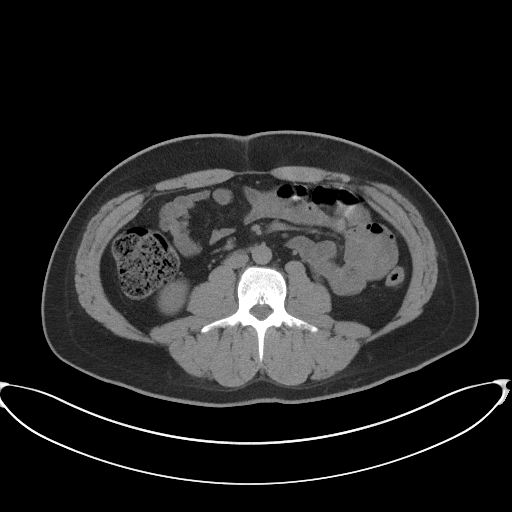
[im 67/102  soft-tissue]
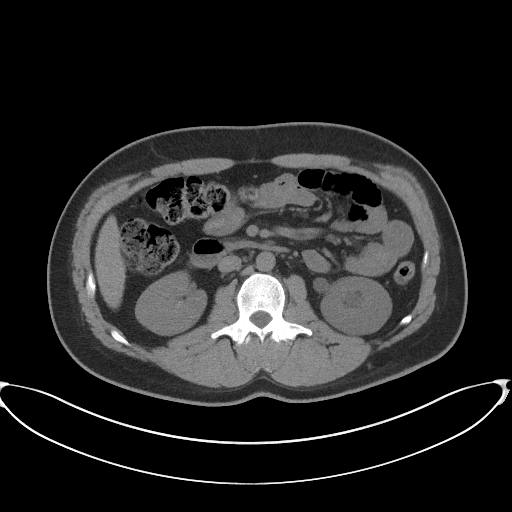
[im 67/102  bone]
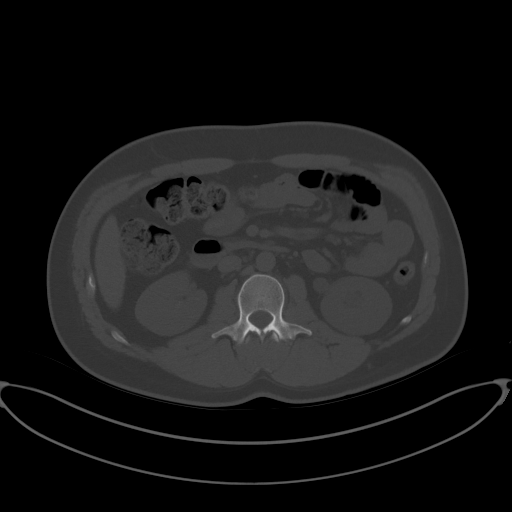
[im 74/102  soft-tissue]
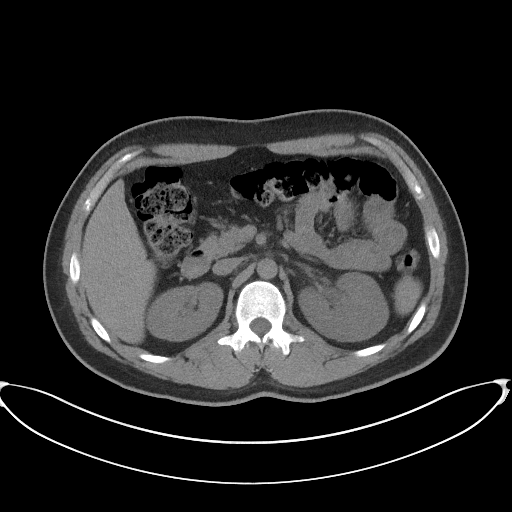
[im 82/102  soft-tissue]
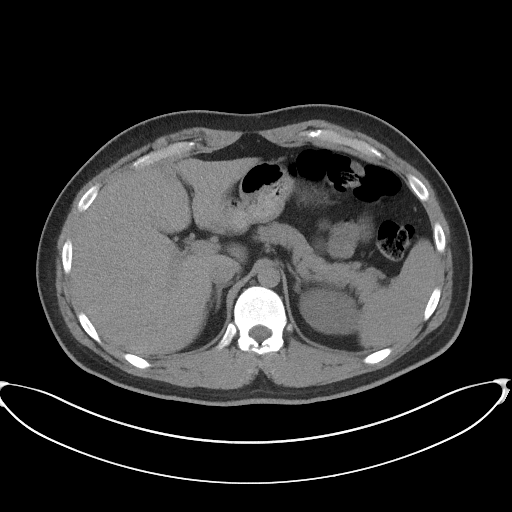
[im 90/102  soft-tissue]
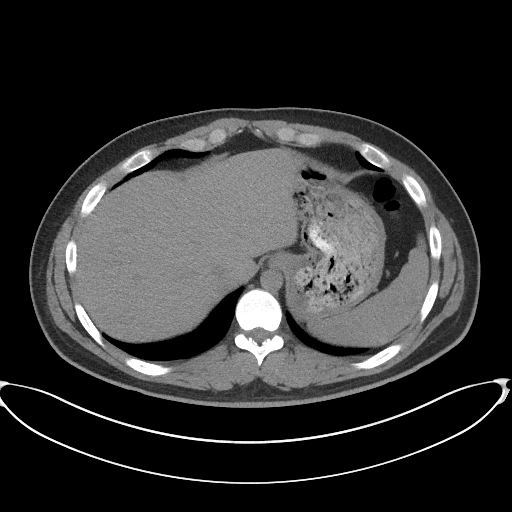
[im 98/102  soft-tissue]
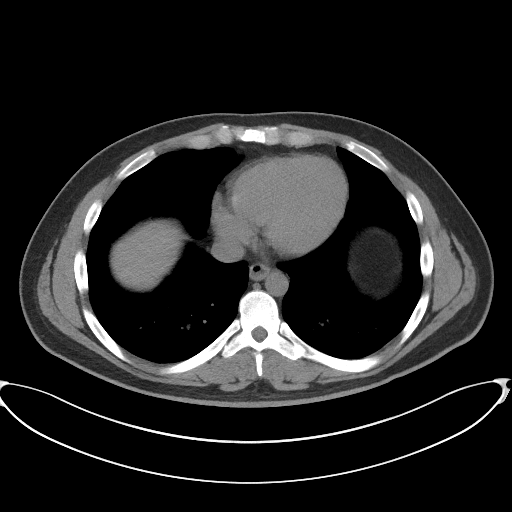

[Series 5: coronal · coronal · 0.83mm/px · 3 of 118 slices shown]
[im 40/118  soft-tissue]
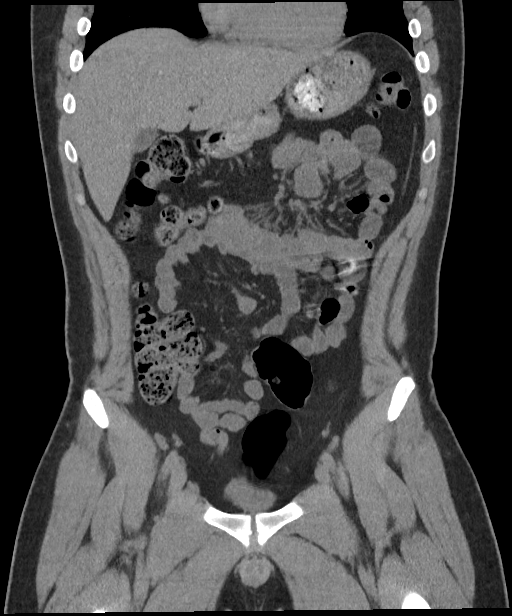
[im 53/118  soft-tissue]
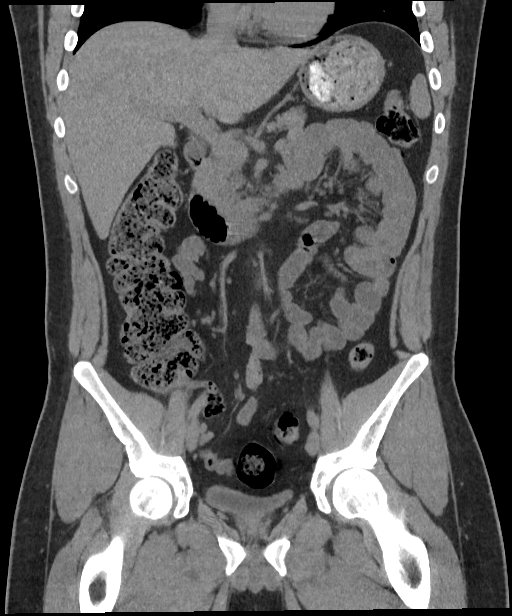
[im 66/118  soft-tissue]
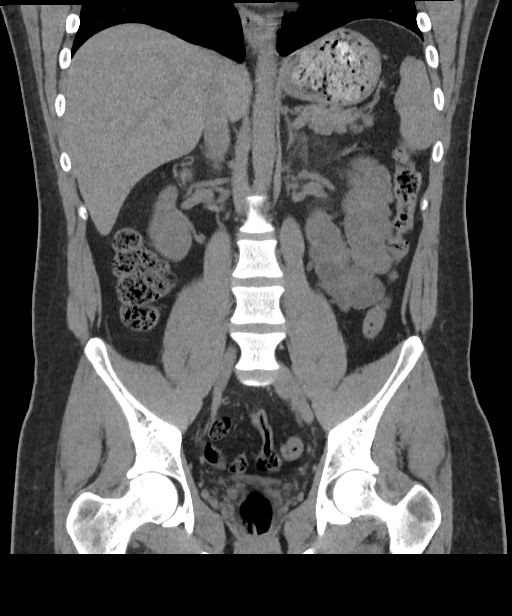

[16 of 46 positions shown; findings below may reference images not displayed]

FINDINGS: Lower chest: Lung bases are normal.

Hepatobiliary: Gallbladder, liver and biliary tree are normal.

Pancreas: Normal.

Spleen: Normal.

Adrenals/Urinary Tract: Adrenal glands are normal. Kidneys are
normal in size. Several punctate right renal stones unchanged which
are nonobstructive. Right ureter is normal. Persistent mild
left-sided hydronephrosis. Few punctate calcifications along the
corticomedullary junction of the left kidney. Previous seen proximal
left ureteral stone has moved distally and now is located over the
distal ureter just above the UVJ. Bladder is otherwise unremarkable.

Stomach/Bowel: Stomach and small bowel are normal. Appendix is
normal. Colon is normal.

Vascular/Lymphatic: Abdominal aorta is normal caliber. Remaining
vascular structures are unremarkable. No adenopathy.

Reproductive: Normal.

Other: No free fluid or focal inflammatory change.

Musculoskeletal: No focal abnormality.
IMPRESSION: 1. Previously seen proximal left ureteral stone has moved distally
and now is located over the distal ureter just above the UVJ.
Persistent mild left-sided hydronephrosis.
2. Bilateral nephrolithiasis.

## 2022-05-24 ENCOUNTER — Ambulatory Visit: Payer: 59 | Admitting: Urology

## 2022-06-21 IMAGING — CT CT RENAL STONE PROTOCOL
4 series · 12 of 46 positions shown, 17 images · non-contrast
Comparison: March 24, 2022 CT renal stone

CLINICAL DATA: Right flank pain and nausea, renal stone suspected.



[Series 2: ap without · axial · non-contrast · 0.76mm/px · z∈[-1080,-730]mm · 7 of 94 slices shown, 12 images]
[im 12/94  soft-tissue]
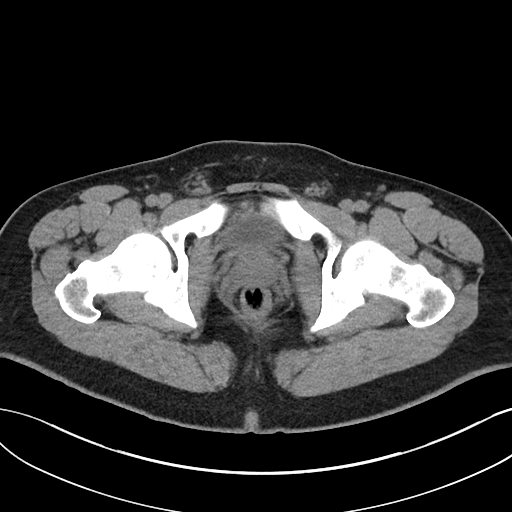
[im 12/94  bone]
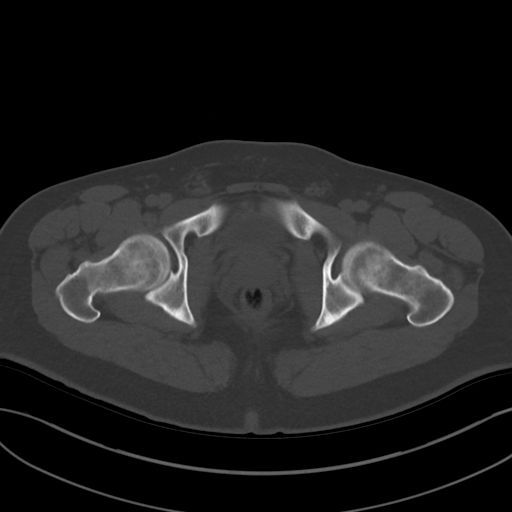
[im 24/94  soft-tissue]
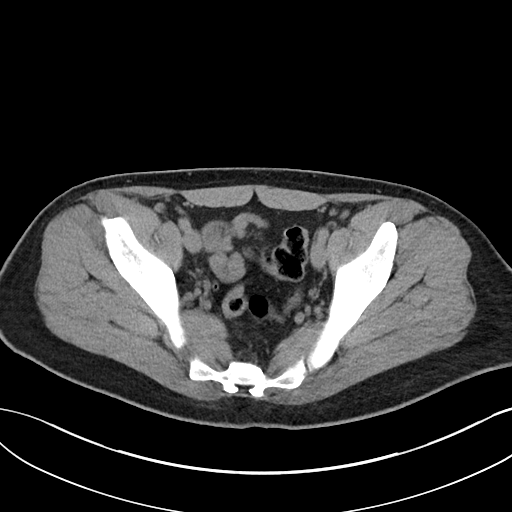
[im 35/94  soft-tissue]
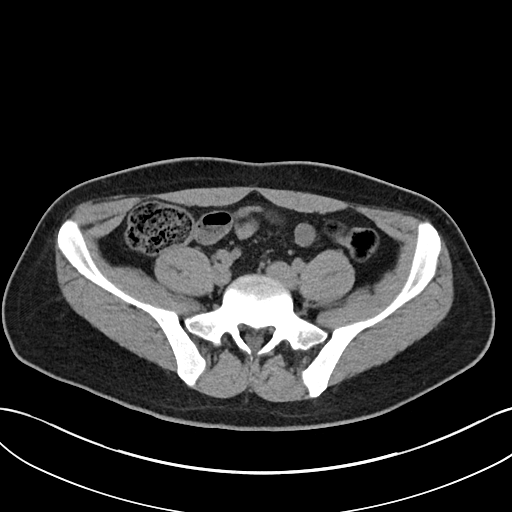
[im 47/94  soft-tissue]
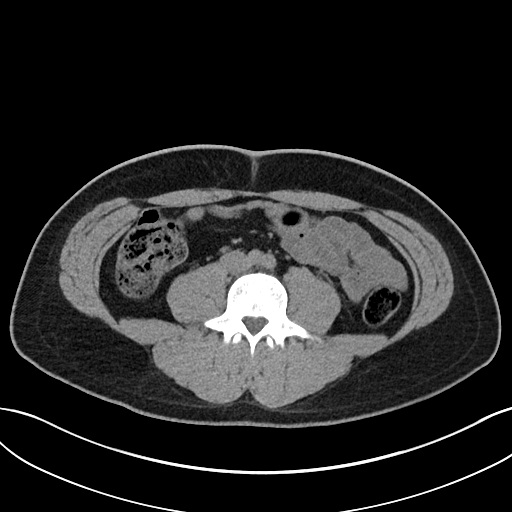
[im 47/94  lung]
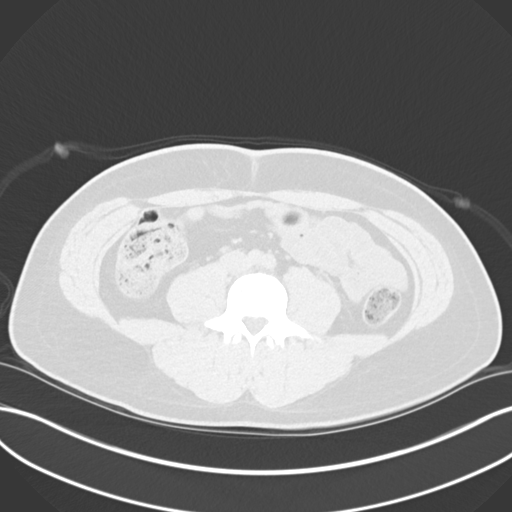
[im 59/94  soft-tissue]
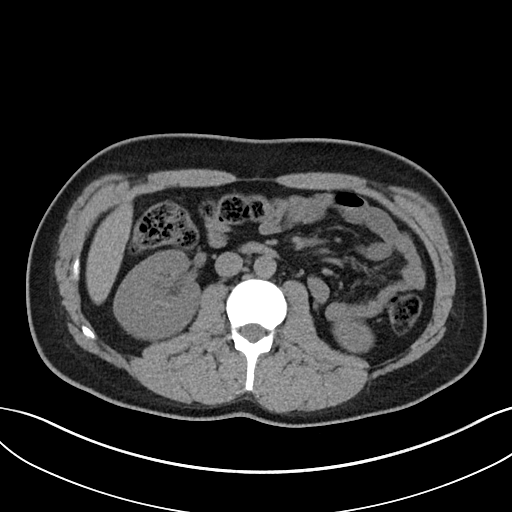
[im 59/94  lung]
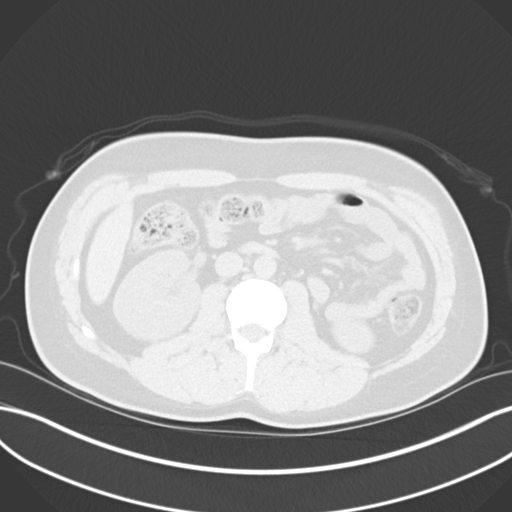
[im 70/94  soft-tissue]
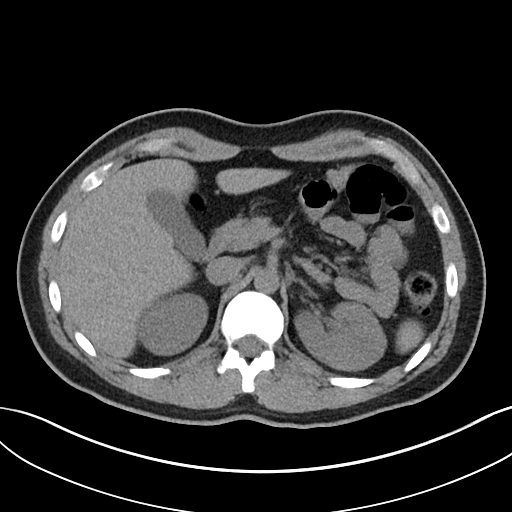
[im 70/94  lung]
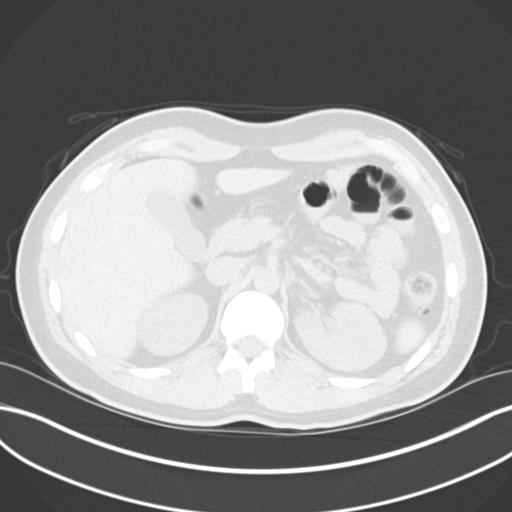
[im 82/94  soft-tissue]
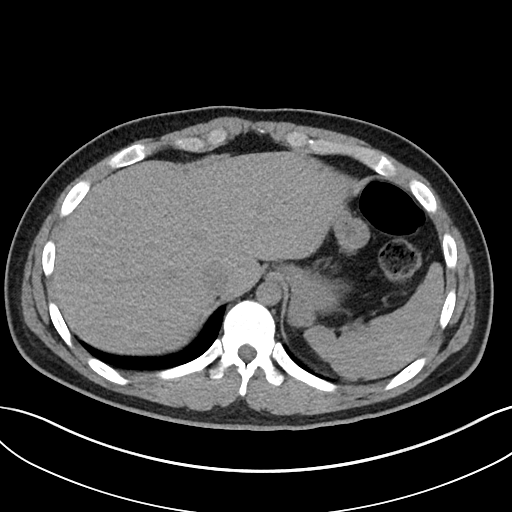
[im 82/94  lung]
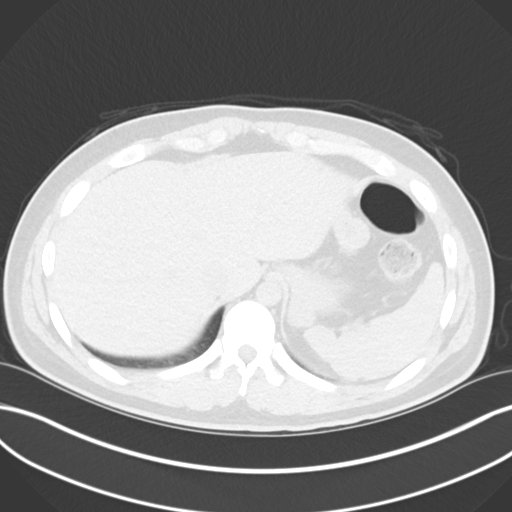

[Series 4: lungs · axial · 0.76mm/px · 1 of 233 slices shown]
[im 22/233  soft-tissue]
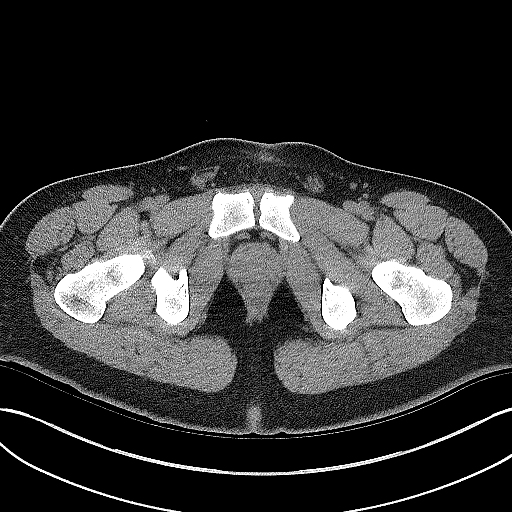

[Series 5: cor · coronal · 0.89mm/px · 3 of 85 slices shown]
[im 29/85  soft-tissue]
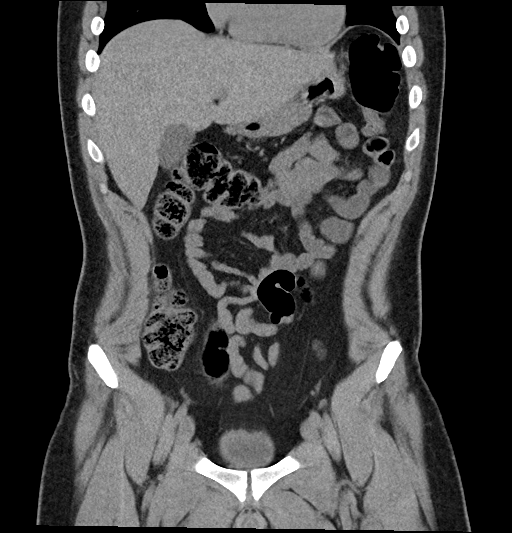
[im 38/85  soft-tissue]
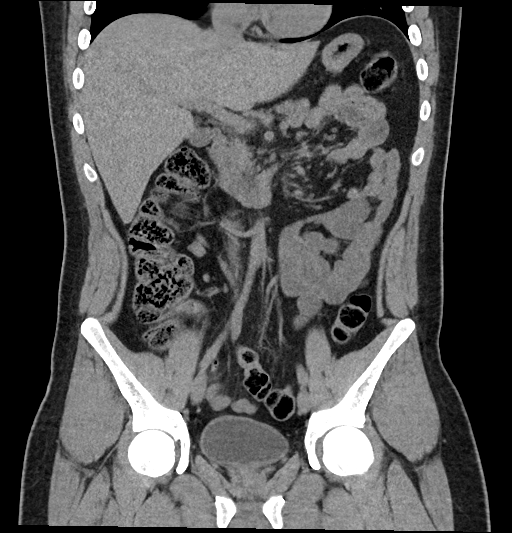
[im 47/85  soft-tissue]
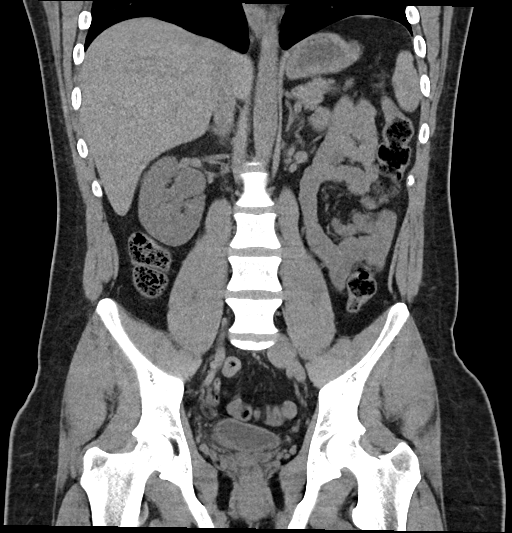

[Series 6: sag · sagittal · 0.50mm/px · 1 of 151 slices shown]
[im 51/151  soft-tissue]
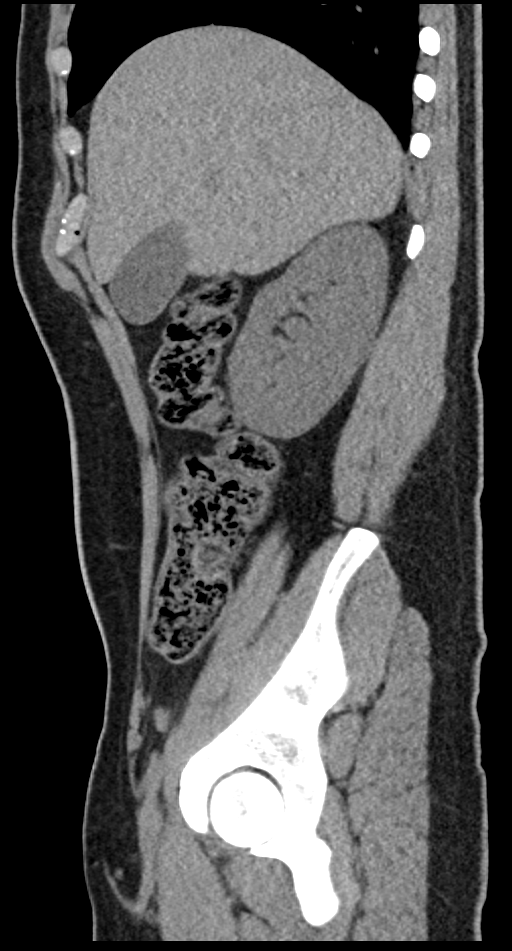

[12 of 46 positions shown; findings below may reference images not displayed]

FINDINGS: Lower chest: No acute abnormality.

Hepatobiliary: Unremarkable noncontrast appearance of the liver and
gallbladder. No biliary ductal dilation.

Pancreas: No pancreatic ductal dilation or evidence of acute
inflammation.

Spleen: No splenomegaly or focal splenic lesion.

Adrenals/Urinary Tract: Bilateral adrenal glands appear normal.

Right-sided hydroureteronephrosis to a 3 mm stone in the distal
ureter just proximal to the ureterovesicular junction. Decreased now
minimal prominence of the left collecting system with interval
passage of the distal left ureteral stone. Additional punctate
nonobstructive bilateral renal stones measure up to 3 mm in the
right kidney. Urinary bladder is unremarkable for degree of
distension.

Stomach/Bowel: Stomach is within normal limits. Appendix appears
normal. No evidence of bowel wall thickening, distention, or
inflammatory changes.

Vascular/Lymphatic: No significant vascular findings are present. No
enlarged abdominal or pelvic lymph nodes.

Reproductive: Prostate is unremarkable.

Other: No abdominal wall hernia or abnormality. No abdominopelvic
ascites.

Musculoskeletal: No acute or significant osseous findings.
IMPRESSION: 1. Mild right-sided hydroureteronephrosis to a 3 mm stone in the
distal ureter near the UVJ.
2. Additional punctate nonobstructive bilateral renal stones measure
up to 3 mm.
3. Decreased now minimal prominence of the left renal collecting
system with interval passage of the distal left ureteral stone.

## 2023-11-18 ENCOUNTER — Emergency Department: Payer: Self-pay

## 2023-11-18 ENCOUNTER — Other Ambulatory Visit: Payer: Self-pay

## 2023-11-18 ENCOUNTER — Emergency Department
Admission: EM | Admit: 2023-11-18 | Discharge: 2023-11-18 | Disposition: A | Discharge status: Home / Self Care | Payer: Self-pay | Attending: Emergency Medicine | Admitting: Emergency Medicine

## 2023-11-18 DIAGNOSIS — R112 Nausea with vomiting, unspecified: Secondary | ICD-10-CM | POA: Insufficient documentation

## 2023-11-18 DIAGNOSIS — A749 Chlamydial infection, unspecified: Secondary | ICD-10-CM | POA: Insufficient documentation

## 2023-11-18 DIAGNOSIS — R197 Diarrhea, unspecified: Secondary | ICD-10-CM | POA: Insufficient documentation

## 2023-11-18 DIAGNOSIS — Z87442 Personal history of urinary calculi: Secondary | ICD-10-CM | POA: Insufficient documentation

## 2023-11-18 LAB — COMPREHENSIVE METABOLIC PANEL
ALT: 48 U/L — ABNORMAL HIGH (ref 0–44)
AST: 32 U/L (ref 15–41)
Albumin: 5.1 g/dL — ABNORMAL HIGH (ref 3.5–5.0)
Alkaline Phosphatase: 56 U/L (ref 38–126)
Anion gap: 13 (ref 5–15)
BUN: 21 mg/dL — ABNORMAL HIGH (ref 6–20)
CO2: 24 mmol/L (ref 22–32)
Calcium: 10.2 mg/dL (ref 8.9–10.3)
Chloride: 100 mmol/L (ref 98–111)
Creatinine, Ser: 1.1 mg/dL (ref 0.61–1.24)
GFR, Estimated: >60 mL/min (ref >60)
Glucose, Bld: 144 mg/dL — ABNORMAL HIGH (ref 70–99)
Potassium: 4.7 mmol/L (ref 3.5–5.1)
Sodium: 137 mmol/L (ref 135–145)
Total Bilirubin: 1 mg/dL (ref 0.0–1.2)
Total Protein: 8.9 g/dL — ABNORMAL HIGH (ref 6.5–8.1)

## 2023-11-18 LAB — URINALYSIS, ROUTINE W REFLEX MICROSCOPIC
Glucose, UA: NEGATIVE mg/dL
Hgb urine dipstick: NEGATIVE
Ketones, ur: NEGATIVE mg/dL
Nitrite: NEGATIVE
Protein, ur: 100 mg/dL — AB
Specific Gravity, Urine: 1.031 — ABNORMAL HIGH (ref 1.005–1.030)
WBC, UA: >50 WBC/hpf (ref 0–5)
pH: 5 (ref 5.0–8.0)

## 2023-11-18 LAB — CBC WITH DIFFERENTIAL/PLATELET
Abs Immature Granulocytes: 0.11 10*3/uL — ABNORMAL HIGH (ref 0.00–0.07)
Basophils Absolute: 0.1 10*3/uL (ref 0.0–0.1)
Basophils Relative: 0 %
Eosinophils Absolute: 0.2 10*3/uL (ref 0.0–0.5)
Eosinophils Relative: 1 %
HCT: 51.5 % (ref 39.0–52.0)
Hemoglobin: 17.7 g/dL — ABNORMAL HIGH (ref 13.0–17.0)
Immature Granulocytes: 0 %
Lymphocytes Relative: 2 %
Lymphs Abs: 0.6 10*3/uL — ABNORMAL LOW (ref 0.7–4.0)
MCH: 29.2 pg (ref 26.0–34.0)
MCHC: 34.4 g/dL (ref 30.0–36.0)
MCV: 84.8 fL (ref 80.0–100.0)
Monocytes Absolute: 0.8 10*3/uL (ref 0.1–1.0)
Monocytes Relative: 3 %
Neutro Abs: 24 10*3/uL — ABNORMAL HIGH (ref 1.7–7.7)
Neutrophils Relative %: 94 %
Platelets: 451 10*3/uL — ABNORMAL HIGH (ref 150–400)
RBC: 6.07 MIL/uL — ABNORMAL HIGH (ref 4.22–5.81)
RDW: 13 % (ref 11.5–15.5)
Smear Review: NORMAL
WBC: 25.8 10*3/uL — ABNORMAL HIGH (ref 4.0–10.5)
nRBC: 0 % (ref 0.0–0.2)

## 2023-11-18 LAB — LACTIC ACID, PLASMA: Lactic Acid, Venous: 1.5 mmol/L (ref 0.5–1.9)

## 2023-11-18 LAB — CHLAMYDIA/NGC RT PCR (ARMC ONLY)
Chlamydia Tr: DETECTED — AB
N gonorrhoeae: NOT DETECTED

## 2023-11-18 LAB — LIPASE, BLOOD: Lipase: 28 U/L (ref 11–51)

## 2023-11-18 MED ORDER — IBUPROFEN 800 MG PO TABS: 800.0000 mg | ORAL_TABLET | Freq: Three times a day (TID) | ORAL | 0 refills | Status: DC | PRN

## 2023-11-18 MED ORDER — DOXYCYCLINE HYCLATE 100 MG PO TABS
100.0000 mg | ORAL_TABLET | Freq: Two times a day (BID) | ORAL | 0 refills | Status: AC
Start: 2023-11-18 — End: 2023-11-25

## 2023-11-18 MED ORDER — ONDANSETRON 4 MG PO TBDP: 4.0000 mg | ORAL_TABLET | Freq: Four times a day (QID) | ORAL | 0 refills | Status: DC | PRN

## 2023-11-18 MED ORDER — DOXYCYCLINE HYCLATE 100 MG PO TABS
100.0000 mg | ORAL_TABLET | Freq: Once | ORAL | Status: AC
Start: 2023-11-18 — End: 2023-11-18
  Administered 2023-11-18: 100 mg via ORAL
  Filled 2023-11-18: qty 1

## 2023-11-18 MED ORDER — ONDANSETRON HCL 4 MG/2ML IJ SOLN
4.0000 mg | Freq: Once | INTRAMUSCULAR | Status: AC | PRN
  Administered 2023-11-18: 4 mg via INTRAVENOUS
  Filled 2023-11-18: qty 2

## 2023-11-18 MED ORDER — SODIUM CHLORIDE 0.9 % IV SOLN
2.0000 g | Freq: Once | INTRAVENOUS | Status: AC
  Administered 2023-11-18: 2 g via INTRAVENOUS
  Filled 2023-11-18: qty 20

## 2023-11-18 MED ORDER — SODIUM CHLORIDE 0.9 % IV BOLUS (SEPSIS)
1000.0000 mL | Freq: Once | INTRAVENOUS | Status: AC
Start: 2023-11-18 — End: 2023-11-18
  Administered 2023-11-18: 1000 mL via INTRAVENOUS

## 2023-11-18 MED ORDER — KETOROLAC TROMETHAMINE 30 MG/ML IJ SOLN
30.0000 mg | Freq: Once | INTRAMUSCULAR | Status: AC
  Administered 2023-11-18: 30 mg via INTRAVENOUS
  Filled 2023-11-18: qty 1

## 2023-11-18 NOTE — ED Triage Notes (Signed)
 Pt to ED via POV c/o left sided flank pain and generalized abd pain. Pt reports this started about 3 days ago but got worse tonight. Also complaining of hematuria. Hx of kidney stones.

## 2023-11-18 NOTE — Discharge Instructions (Addendum)
 You may alternate Tylenol  1000 mg every 6 hours as needed for pain, fever and Ibuprofen  800 mg every 6-8 hours as needed for pain, fever.  Please take Ibuprofen  with food.  Do not take more than 4000 mg of Tylenol  (acetaminophen ) in a 24 hour period.  We have sent a prescription of Zofran  for nausea and vomiting to your pharmacy.  You may use over-the-counter Imodium for diarrhea.  Your urine has tested positive for chlamydia.  It was negative for gonorrhea.  I recommend further STD screening including HIV and syphilis testing which can be done by your primary care doctor or at the health department.  Please take your antibiotics as prescribed.  Please have any recent sexual partners treated as well.  I recommend you avoid sexual intercourse including anal, vaginal and oral for at least 1 week after antibiotics are completed for both you and your partner(s) and all symptoms have resolved.   Please go to the following website to schedule new (and existing) patient appointments:   [email or web address]   The following is a list of primary care offices in the area who are accepting new patients at this time.  Please reach out to one of them directly and let them know you would like to schedule an appointment to follow up on an Emergency Department visit, and/or to establish a new primary care provider (PCP).  There are likely other primary care clinics in the are who are accepting new patients, but this is an excellent place to start:  [facility] Lead physician: Dr Jon Eva [address] [place], [place] [address] [phone]  [facility] Lead Physician: Dr Dorette Loron [address], [place], [place] [address] [phone]  [facility]  Lead Physician: Dr Duwaine Louder [address] [place], [place] [address] [phone]  [facility] Lead Physician: Dr Marolyn Officer [address] [place], [place] [address] [phone]  [facility] Primary Care & Sports Medicine at [facility] Lead Physician: Dr Leita Adie [address] [place], [place], [place] [address] [phone]

## 2023-11-18 NOTE — ED Provider Notes (Signed)
 [facility] Provider Note    Event Date/Time   First MD Initiated Contact with Patient 11/18/23 [phone]     (approximate)   History   Flank Pain   HPI  Jesse Carlson is a 28 y.o. male with history of kidney stone who presents to the emergency department with left flank pain that started yesterday.  He has also had nausea, vomiting and diarrhea.  Has noticed some clear penile discharge and hematuria but no dysuria.  No known fevers.  Was just recently getting over a respiratory infection but states this has resolved.  No longer having cough, congestion, sore throat.  No previous abdominal surgery other than cystoscopy, ureteroscopy, stent placement for kidney stone.  Patient is married.  Significant other at bedside.  Denies any concern for STI.  Denies testicular pain, scrotal swelling.   History provided by patient, significant other.    Past Medical History:  Diagnosis Date   Kidney calculus     Past Surgical History:  Procedure Laterality Date   CYSTOSCOPY W/ RETROGRADES Left 03/28/2022   Procedure: CYSTOSCOPY WITH RETROGRADE PYELOGRAM;  Surgeon: Twylla Glendia BROCKS, MD;  Location: [facility] ORS;  Service: Urology;  Laterality: Left;   CYSTOSCOPY/URETEROSCOPY/HOLMIUM LASER/STENT PLACEMENT Left 03/28/2022   Procedure: CYSTOSCOPY/URETEROSCOPY/HOLMIUM LASER/STENT PLACEMENT;  Surgeon: Twylla Glendia BROCKS, MD;  Location: [facility] ORS;  Service: Urology;  Laterality: Left;    MEDICATIONS:  Prior to Admission medications   Medication Sig Start Date End Date Taking? Authorizing Provider  ondansetron  (ZOFRAN -ODT) 4 MG disintegrating tablet Take 1 tablet (4 mg total) by mouth every 8 (eight) hours as needed for nausea or vomiting. 05/03/22   Willo Dunnings, MD  tamsulosin  (FLOMAX ) 0.4 MG CAPS capsule Take 1 capsule (0.4 mg total) by mouth daily after breakfast. 05/10/22   Twylla Glendia BROCKS, MD    Physical Exam   Triage Vital Signs: ED Triage Vitals  Encounter Vitals  Group     BP 11/18/23 0257 (!) 143/69     Systolic BP Percentile --      Diastolic BP Percentile --      Pulse Rate 11/18/23 0257 (!) 110     Resp 11/18/23 0257 20     Temp 11/18/23 0257 98.2 F (36.8 C)     Temp Source 11/18/23 0257 Oral     SpO2 11/18/23 0257 100 %     Weight --      Height --      Head Circumference --      Peak Flow --      Pain Score 11/18/23 0254 10     Pain Loc --      Pain Education --      Exclude from Growth Chart --     Most recent vital signs: Vitals:   11/18/23 0257  BP: (!) 143/69  Pulse: (!) 110  Resp: 20  Temp: 98.2 F (36.8 C)  SpO2: 100%    CONSTITUTIONAL: Alert, responds appropriately to questions. Well-appearing; well-nourished HEAD: Normocephalic, atraumatic EYES: Conjunctivae clear, pupils appear equal, sclera nonicteric ENT: normal nose; moist mucous membranes NECK: Supple, normal ROM CARD: Regular and tachycardic; S1 and S2 appreciated RESP: Normal chest excursion without splinting or tachypnea; breath sounds clear and equal bilaterally; no wheezes, no rhonchi, no rales, no hypoxia or respiratory distress, speaking full sentences ABD/GI: Non-distended; soft, non-tender, no rebound, no guarding, no peritoneal signs BACK: The back appears normal EXT: Normal ROM in all joints; no deformity noted, no edema SKIN: Normal color  for age and race; warm; no rash on exposed skin NEURO: Moves all extremities equally, normal speech PSYCH: The patient's mood and manner are appropriate.   ED Results / Procedures / Treatments   LABS: (all labs ordered are listed, but only abnormal results are displayed) Labs Reviewed  CHLAMYDIA/NGC RT PCR ([facility] ONLY)           - Abnormal; Notable for the following components:      Result Value   Chlamydia Tr DETECTED (*)    All other components within normal limits  CBC WITH DIFFERENTIAL/PLATELET - Abnormal; Notable for the following components:   WBC 25.8 (*)    RBC 6.07 (*)    Hemoglobin 17.7 (*)     Platelets 451 (*)    Neutro Abs 24.0 (*)    Lymphs Abs 0.6 (*)    Abs Immature Granulocytes 0.11 (*)    All other components within normal limits  COMPREHENSIVE METABOLIC PANEL - Abnormal; Notable for the following components:   Glucose, Bld 144 (*)    BUN 21 (*)    Total Protein 8.9 (*)    Albumin 5.1 (*)    ALT 48 (*)    All other components within normal limits  URINALYSIS, ROUTINE W REFLEX MICROSCOPIC - Abnormal; Notable for the following components:   Color, Urine AMBER (*)    APPearance CLOUDY (*)    Specific Gravity, Urine 1.031 (*)    Bilirubin Urine SMALL (*)    Protein, ur 100 (*)    Leukocytes,Ua MODERATE (*)    Bacteria, UA RARE (*)    All other components within normal limits  CULTURE, BLOOD (SINGLE)  URINE CULTURE  LIPASE, BLOOD  LACTIC ACID, PLASMA     EKG:     RADIOLOGY: My personal review and interpretation of imaging: CT shows no acute abnormality.  Normal appendix.  I have personally reviewed all radiology reports.   CT Renal Stone Study Result Date: 11/18/2023 CLINICAL DATA:  Left flank pain EXAM: CT ABDOMEN AND PELVIS WITHOUT CONTRAST TECHNIQUE: Multidetector CT imaging of the abdomen and pelvis was performed following the standard protocol without IV contrast. RADIATION DOSE REDUCTION: This exam was performed according to the departmental dose-optimization program which includes automated exposure control, adjustment of the mA and/or kV according to patient size and/or use of iterative reconstruction technique. COMPARISON:  05/03/2022 FINDINGS: Lower chest: No acute abnormality Hepatobiliary: No focal hepatic abnormality. Gallbladder unremarkable. Pancreas: No focal abnormality or ductal dilatation. Spleen: No focal abnormality.  Normal size. Adrenals/Urinary Tract: No adrenal abnormality. No focal renal abnormality. No stones or hydronephrosis. Urinary bladder is unremarkable. Stomach/Bowel: Normal appendix. Stomach, large and small bowel grossly  unremarkable. Vascular/Lymphatic: No evidence of aneurysm or adenopathy. Reproductive: No visible focal abnormality. Other: No free fluid or free air. Musculoskeletal: No acute bony abnormality. IMPRESSION: No acute findings in the abdomen or pelvis. Electronically Signed   By: Franky Crease M.D.   On: 11/18/2023 03:32     PROCEDURES:  Critical Care performed: No      Procedures    IMPRESSION / MDM / ASSESSMENT AND PLAN / ED COURSE  I reviewed the triage vital signs and the nursing notes.    Patient here with abdominal pain, vomiting and diarrhea.    DIFFERENTIAL DIAGNOSIS (includes but not limited to):   Viral gastroenteritis, dehydration, kidney stone, UTI, pyelonephritis, appendicitis, colitis, diverticulitis   Patient's presentation is most consistent with acute presentation with potential threat to life or bodily function.   PLAN:  Workup initiated from triage.  Patient has a leukocytosis of 25,000.  He is slightly tachycardic here but normotensive, afebrile.  Normal electrolytes, LFTs, lipase.  Urine does appear infected.  He denies any concern for STIs as he is in a monogamous relationship with his wife.  Urine culture pending.  Will add on urine gonorrhea and Chlamydia testing as well given reports of penile discharge.  He denies any testicular pain or swelling.  CT scan obtained from triage and shows no acute abnormality.  Normal appendix, bowel, gallbladder and kidneys.  Will give Rocephin , Toradol , Zofran  and IV fluids for symptomatic relief.  Will p.o. challenge.   MEDICATIONS GIVEN IN ED: Medications  doxycycline  (VIBRA -TABS) tablet 100 mg (has no administration in time range)  ondansetron  (ZOFRAN ) injection 4 mg (4 mg Intravenous Given 11/18/23 0302)  cefTRIAXone  (ROCEPHIN ) 2 g in sodium chloride  0.9 % 100 mL IVPB (2 g Intravenous New Bag/Given 11/18/23 0451)  sodium chloride  0.9 % bolus 1,000 mL (1,000 mLs Intravenous New Bag/Given 11/18/23 0448)  ketorolac  (TORADOL ) 30  MG/ML injection 30 mg (30 mg Intravenous Given 11/18/23 0448)     ED COURSE: Patient has been resting comfortably.  Tolerating p.o.  States he is feeling better.  Patient's urine has come back positive for chlamydia.  Gonorrhea negative.  Will start him on doxycycline .  Have advised him that any recent sexual partners will need to be treated as well and that he should avoid sexual intercourse for at least 1 week after treatment complete and all symptoms resolved.  Recommended alternating Tylenol , Motrin  for discomfort.  Will discharge with Zofran  for nausea and vomiting.  Recommended over-the-counter Imodium for diarrhea.   Vital signs have improved.  Lactic normal.  I feel he is safe for discharge home.  At this time, I do not feel there is any life-threatening condition present. I reviewed all nursing notes, vitals, pertinent previous records.  All lab and urine results, EKGs, imaging ordered have been independently reviewed and interpreted by myself.  I reviewed all available radiology reports from any imaging ordered this visit.  Based on my assessment, I feel the patient is safe to be discharged home without further emergent workup and can continue workup as an outpatient as needed. Discussed all findings, treatment plan as well as usual and customary return precautions.  They verbalize understanding and are comfortable with this plan.  Outpatient follow-up has been provided as needed.  All questions have been answered.  CONSULTS:  none   OUTSIDE RECORDS REVIEWED: Reviewed previous urology notes in May 2023.       FINAL CLINICAL IMPRESSION(S) / ED DIAGNOSES   Final diagnoses:  Chlamydia  Nausea vomiting and diarrhea     Rx / DC Orders   ED Discharge Orders          Ordered    ibuprofen  (ADVIL ) 800 MG tablet  Every 8 hours PRN        11/18/23 0615    ondansetron  (ZOFRAN -ODT) 4 MG disintegrating tablet  Every 6 hours PRN        11/18/23 0615    doxycycline  (VIBRA -TABS) 100 MG  tablet  2 times daily        11/18/23 0615             Note:  This document was prepared using Dragon voice recognition software and may include unintentional dictation errors.   Mendi Constable, Josette SAILOR, DO 11/18/23 [phone]

## 2023-11-19 LAB — URINE CULTURE: Culture: NO GROWTH

## 2023-11-23 LAB — CULTURE, BLOOD (SINGLE): Culture: NO GROWTH

## 2024-07-13 ENCOUNTER — Emergency Department
Admission: EM | Admit: 2024-07-13 | Discharge: 2024-07-13 | Disposition: A | Discharge status: Home / Self Care | Payer: Self-pay | Attending: Emergency Medicine | Admitting: Emergency Medicine

## 2024-07-13 ENCOUNTER — Encounter: Payer: Self-pay | Admitting: Emergency Medicine

## 2024-07-13 ENCOUNTER — Other Ambulatory Visit: Payer: Self-pay

## 2024-07-13 ENCOUNTER — Emergency Department: Payer: Self-pay

## 2024-07-13 DIAGNOSIS — L03114 Cellulitis of left upper limb: Secondary | ICD-10-CM | POA: Insufficient documentation

## 2024-07-13 LAB — CBC WITH DIFFERENTIAL/PLATELET
Abs Immature Granulocytes: 0.04 K/uL (ref 0.00–0.07)
Basophils Absolute: 0.1 K/uL (ref 0.0–0.1)
Basophils Relative: 1 %
Eosinophils Absolute: 0.4 K/uL (ref 0.0–0.5)
Eosinophils Relative: 4 %
HCT: 44 % (ref 39.0–52.0)
Hemoglobin: 15.1 g/dL (ref 13.0–17.0)
Immature Granulocytes: 0 %
Lymphocytes Relative: 20 %
Lymphs Abs: 2.4 K/uL (ref 0.7–4.0)
MCH: 29.6 pg (ref 26.0–34.0)
MCHC: 34.3 g/dL (ref 30.0–36.0)
MCV: 86.3 fL (ref 80.0–100.0)
Monocytes Absolute: 1 K/uL (ref 0.1–1.0)
Monocytes Relative: 9 %
Neutro Abs: 7.9 K/uL — ABNORMAL HIGH (ref 1.7–7.7)
Neutrophils Relative %: 66 %
Platelets: 338 K/uL (ref 150–400)
RBC: 5.1 MIL/uL (ref 4.22–5.81)
RDW: 12.4 % (ref 11.5–15.5)
WBC: 11.8 K/uL — ABNORMAL HIGH (ref 4.0–10.5)
nRBC: 0 % (ref 0.0–0.2)

## 2024-07-13 LAB — COMPREHENSIVE METABOLIC PANEL WITH GFR
ALT: 18 U/L (ref 0–44)
AST: 15 U/L (ref 15–41)
Albumin: 4.4 g/dL (ref 3.5–5.0)
Alkaline Phosphatase: 54 U/L (ref 38–126)
Anion gap: 11 (ref 5–15)
BUN: 19 mg/dL (ref 6–20)
CO2: 29 mmol/L (ref 22–32)
Calcium: 9.8 mg/dL (ref 8.9–10.3)
Chloride: 98 mmol/L (ref 98–111)
Creatinine, Ser: 1.12 mg/dL (ref 0.61–1.24)
GFR, Estimated: >60 mL/min (ref >60)
Glucose, Bld: 109 mg/dL — ABNORMAL HIGH (ref 70–99)
Potassium: 3.6 mmol/L (ref 3.5–5.1)
Sodium: 138 mmol/L (ref 135–145)
Total Bilirubin: 0.8 mg/dL (ref 0.0–1.2)
Total Protein: 7.9 g/dL (ref 6.5–8.1)

## 2024-07-13 LAB — LACTIC ACID, PLASMA: Lactic Acid, Venous: 0.5 mmol/L (ref 0.5–1.9)

## 2024-07-13 MED ORDER — CEFADROXIL 500 MG PO CAPS: 1000.0000 mg | ORAL_CAPSULE | Freq: Two times a day (BID) | ORAL | 0 refills | Status: AC

## 2024-07-13 MED ORDER — DEXAMETHASONE 10 MG/ML FOR PEDIATRIC ORAL USE
10.0000 mg | Freq: Once | INTRAMUSCULAR | Status: AC
  Administered 2024-07-13: 10 mg via ORAL
  Filled 2024-07-13 (×2): qty 1

## 2024-07-13 MED ORDER — SULFAMETHOXAZOLE-TRIMETHOPRIM 800-160 MG PO TABS
1.0000 | ORAL_TABLET | Freq: Once | ORAL | Status: AC
  Administered 2024-07-13: 1 via ORAL
  Filled 2024-07-13: qty 1

## 2024-07-13 MED ORDER — SULFAMETHOXAZOLE-TRIMETHOPRIM 800-160 MG PO TABS: 1.0000 | ORAL_TABLET | Freq: Two times a day (BID) | ORAL | 0 refills | Status: AC

## 2024-07-13 MED ORDER — CEPHALEXIN 500 MG PO CAPS
500.0000 mg | ORAL_CAPSULE | Freq: Once | ORAL | Status: AC
  Administered 2024-07-13: 500 mg via ORAL
  Filled 2024-07-13: qty 1

## 2024-07-13 NOTE — ED Notes (Signed)
 Triage attempted to call RN assigned to room to make aware rooming pt from triage to 17, no answer

## 2024-07-13 NOTE — ED Provider Notes (Signed)
 [facility] Provider Note    Event Date/Time   First MD Initiated Contact with Patient 07/13/24 0139     (approximate)   History   Foreign Body in Skin   HPI Jesse Carlson is a 28 y.o. male who presents for possible foreign body in his left antecubital fossa.  He reports that he thinks an insulin needle has been in there since yesterday when it broke off when he was injecting himself.  He denies IV drug use but declines to state what he is injecting.  He has an area of redness all around the site that extends out to his forearm and about midway up his upper arm.  No fever, chills, nausea, vomiting, chest pain, nor shortness of breath.  There is some swelling of the area.     Physical Exam   Triage Vital Signs: ED Triage Vitals  Encounter Vitals Group     BP 07/13/24 0019 133/76     Girls Systolic BP Percentile --      Girls Diastolic BP Percentile --      Boys Systolic BP Percentile --      Boys Diastolic BP Percentile --      Pulse Rate 07/13/24 0019 85     Resp 07/13/24 0019 18     Temp 07/13/24 0019 98.1 F (36.7 C)     Temp src --      SpO2 07/13/24 0019 100 %     Weight 07/13/24 0020 99.8 kg (220 lb)     Height --      Head Circumference --      Peak Flow --      Pain Score 07/13/24 0019 0     Pain Loc --      Pain Education --      Exclude from Growth Chart --     Most recent vital signs: Vitals:   07/13/24 0019 07/13/24 0311  BP: 133/76 107/68  Pulse: 85 78  Resp: 18 16  Temp: 98.1 F (36.7 C) (!) 97.2 F (36.2 C)  SpO2: 100% 96%    General: Awake, no obvious distress. CV:  Good peripheral perfusion.  Regular rate and rhythm, no tachycardia. Resp:  Normal effort. Speaking easily and comfortably, no accessory muscle usage nor intercostal retractions.   Abd:  No distention.  Other:  Patient has an area of induration on the lateral aspect of the left antecubital fossa with surrounding erythema that extends at least 10 cm  in diameter onto the forearm and the upper arm.  It is warm to the touch and consistent with cellulitis.  The central area is several centimeters in diameter and is indurated but not fluctuant.  There is no visible or palpable foreign body.  Multiple track marks are present on both of his arms.   ED Results / Procedures / Treatments   Labs (all labs ordered are listed, but only abnormal results are displayed) Labs Reviewed  COMPREHENSIVE METABOLIC PANEL WITH GFR - Abnormal; Notable for the following components:      Result Value   Glucose, Bld 109 (*)    All other components within normal limits  CBC WITH DIFFERENTIAL/PLATELET - Abnormal; Notable for the following components:   WBC 11.8 (*)    Neutro Abs 7.9 (*)    All other components within normal limits  LACTIC ACID, PLASMA     RADIOLOGY I independently viewed and interpreted the patient's elbow x-rays and I see no radiopaque foreign  body, and this was confirmed by the radiologist.   PROCEDURES:  Critical Care performed: No  Procedures    IMPRESSION / MDM / ASSESSMENT AND PLAN / ED COURSE  I reviewed the triage vital signs and the nursing notes.                              Differential diagnosis includes, but is not limited to, metallic foreign body, cellulitis, abscess, aneurysm or pseudoaneurysm.  Patient's presentation is most consistent with acute presentation with potential threat to life or bodily function.  Labs/studies ordered: CBC with differential, comprehensive metabolic panel, lactic acid, elbow x-rays  Interventions/Medications given:  Medications  sulfamethoxazole -trimethoprim  (BACTRIM  DS) 800-160 MG per tablet 1 tablet (1 tablet Oral Given 07/13/24 0311)  cephALEXin  (KEFLEX ) capsule 500 mg (500 mg Oral Given 07/13/24 0311)  dexamethasone  (DECADRON ) 10 MG/ML injection for Pediatric ORAL use 10 mg (10 mg Oral Given 07/13/24 0316)    (Note:  hospital course my include additional interventions and/or  labs/studies not listed above.)   No needle evident on x-rays.  I also performed a bedside ultrasound and identified no needle.  The area of induration is consistent with cellulitis (cobblestoning) and there is some diffuse fluid but no drainable fluid collection.  Of note, a vein runs right through the middle of the area of cobblestoning and I am concerned about the possibility of excessive bleeding should I&D be attempted.  I explained that I do not think he has any drainable amount of purulent material at this time and he agrees he does not want I&D.  He agrees with empiric antibiotics and I have put him on a course of cefadroxil  1 g twice daily x 14 days and Bactrim  DS 1 tablet twice daily x 14 days.  I gave him follow-up information in surgery clinic.  I also gave him a dose of Decadron  10 mg IV to try to help with the inflammation and prevent additional spread of the cellulitis.  I gave strict return precautions and follow-up recommendations.  He understands and agrees with the plan.  No systemic signs of illness and no sign of sepsis at this time; he has only a minimal leukocytosis, no tachycardia, no fever, no other worrisome systemic signs or symptoms         FINAL CLINICAL IMPRESSION(S) / ED DIAGNOSES   Final diagnoses:  Cellulitis of left arm     Rx / DC Orders   ED Discharge Orders          Ordered    sulfamethoxazole -trimethoprim  (BACTRIM  DS) 800-160 MG tablet  2 times daily        07/13/24 0242    cefadroxil  (DURICEF) 500 MG capsule  2 times daily        07/13/24 0242    Ambulatory Referral to Primary Care (Establish Care)        07/13/24 0243             Note:  This document was prepared using Dragon voice recognition software and may include unintentional dictation errors.   Gordan Huxley, MD 07/13/24 [phone]

## 2024-07-13 NOTE — Discharge Instructions (Addendum)
 As we discussed, it is importantly take the antibiotics as prescribed.  Your infection may still get worse.  We recommend that you call the office of Dr. Desiderio to schedule a follow-up appointment in the general surgery clinic with him or one of his colleagues.  They will be able to help identify if you need a procedure to drain some of the infection, even though there is nothing drainable at this time.  If you develop new or worsening symptoms that concern you, please return immediately to the emergency department.

## 2024-07-13 NOTE — ED Triage Notes (Addendum)
 Patient reports insulin gauge needle in left AC since yesterday.  Patient reports arm has had redness around area that has gotten larger since yesterday.  Patient does not want to report what he was injecting. Patient does have swelling and redness to [facility] region of left arm.

## 2024-11-17 ENCOUNTER — Emergency Department
Admission: EM | Admit: 2024-11-17 | Discharge: 2024-11-17 | Disposition: A | Discharge status: Home / Self Care | Payer: Self-pay | Attending: Emergency Medicine | Admitting: Emergency Medicine

## 2024-11-17 ENCOUNTER — Other Ambulatory Visit: Payer: Self-pay

## 2024-11-17 DIAGNOSIS — L03113 Cellulitis of right upper limb: Secondary | ICD-10-CM | POA: Insufficient documentation

## 2024-11-17 LAB — CBC WITH DIFFERENTIAL/PLATELET
Abs Immature Granulocytes: 0.05 K/uL (ref 0.00–0.07)
Basophils Absolute: 0.1 K/uL (ref 0.0–0.1)
Basophils Relative: 0 %
Eosinophils Absolute: 0.3 K/uL (ref 0.0–0.5)
Eosinophils Relative: 2 %
HCT: 46.9 % (ref 39.0–52.0)
Hemoglobin: 15.6 g/dL (ref 13.0–17.0)
Immature Granulocytes: 0 %
Lymphocytes Relative: 11 %
Lymphs Abs: 1.5 K/uL (ref 0.7–4.0)
MCH: 28.9 pg (ref 26.0–34.0)
MCHC: 33.3 g/dL (ref 30.0–36.0)
MCV: 87 fL (ref 80.0–100.0)
Monocytes Absolute: 1.2 K/uL — ABNORMAL HIGH (ref 0.1–1.0)
Monocytes Relative: 9 %
Neutro Abs: 10.3 K/uL — ABNORMAL HIGH (ref 1.7–7.7)
Neutrophils Relative %: 78 %
Platelets: 351 K/uL (ref 150–400)
RBC: 5.39 MIL/uL (ref 4.22–5.81)
RDW: 12.6 % (ref 11.5–15.5)
WBC: 13.3 K/uL — ABNORMAL HIGH (ref 4.0–10.5)
nRBC: 0 % (ref 0.0–0.2)

## 2024-11-17 LAB — BASIC METABOLIC PANEL WITH GFR
Anion gap: 12 (ref 5–15)
BUN: 14 mg/dL (ref 6–20)
CO2: 28 mmol/L (ref 22–32)
Calcium: 9.6 mg/dL (ref 8.9–10.3)
Chloride: 98 mmol/L (ref 98–111)
Creatinine, Ser: 0.98 mg/dL (ref 0.61–1.24)
GFR, Estimated: >60 mL/min
Glucose, Bld: 134 mg/dL — ABNORMAL HIGH (ref 70–99)
Potassium: 4 mmol/L (ref 3.5–5.1)
Sodium: 137 mmol/L (ref 135–145)

## 2024-11-17 MED ORDER — SULFAMETHOXAZOLE-TRIMETHOPRIM 800-160 MG PO TABS: 1.0000 | ORAL_TABLET | Freq: Once | ORAL | Status: DC

## 2024-11-17 MED ORDER — CEFADROXIL 500 MG PO CAPS
1000.0000 mg | ORAL_CAPSULE | Freq: Once | ORAL | Status: DC
  Filled 2024-11-17: qty 2

## 2024-11-17 MED ORDER — CEFADROXIL 500 MG PO CAPS: 1000.0000 mg | ORAL_CAPSULE | Freq: Two times a day (BID) | ORAL | 0 refills | Status: AC

## 2024-11-17 MED ORDER — SULFAMETHOXAZOLE-TRIMETHOPRIM 800-160 MG PO TABS: 1.0000 | ORAL_TABLET | Freq: Two times a day (BID) | ORAL | 0 refills | Status: AC

## 2024-11-17 NOTE — Discharge Instructions (Addendum)
 You are being treated for a cellulitis of the upper extremity.  You been started empirically on a course of 2 different antibiotics to treat the skin infection.  It does not appear to be any drainable abscess at this time.  Continue to monitor for any changes, and return to the ED for any spontaneous purulent drainage, fevers, chills, or sweats.  Follow-up with a local community clinic or return to this ED for any concerning symptoms as discussed.

## 2024-11-17 NOTE — ED Triage Notes (Signed)
 Pt comes with headache and right arm swelling that started yesterday. Pt states swelling redness and warmth to forearm.

## 2024-11-17 NOTE — ED Notes (Signed)
 Patient left before receiving medications and discharge papers. This RN attempted to call patient's cell phone x2. This RN left patient a voicemail asking to go over patient's discharge instructions and prescriptions.
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
